# Patient Record
Sex: Female | Born: 1990
Health system: Southern US, Community
[De-identification: ages and names within clinical notes are randomized; demographics above are authoritative.]

## PROBLEM LIST (undated history)

## (undated) DIAGNOSIS — J45909 Unspecified asthma, uncomplicated: Secondary | ICD-10-CM

## (undated) DIAGNOSIS — N200 Calculus of kidney: Secondary | ICD-10-CM

## (undated) DIAGNOSIS — F419 Anxiety disorder, unspecified: Secondary | ICD-10-CM

## (undated) HISTORY — PX: WISDOM TOOTH EXTRACTION: SHX21

## (undated) HISTORY — DX: Anxiety disorder, unspecified: F41.9

## (undated) HISTORY — DX: Unspecified asthma, uncomplicated: J45.909

---

## 2014-03-12 ENCOUNTER — Encounter: Payer: Self-pay | Admitting: Family Medicine

## 2014-03-12 ENCOUNTER — Encounter (INDEPENDENT_AMBULATORY_CARE_PROVIDER_SITE_OTHER): Payer: Self-pay

## 2014-03-12 ENCOUNTER — Ambulatory Visit (INDEPENDENT_AMBULATORY_CARE_PROVIDER_SITE_OTHER): Payer: 59 | Admitting: Family Medicine

## 2014-03-12 VITALS — BP 110/62 | HR 70 | Temp 97.7°F | Ht 64.0 in | Wt 166.5 lb

## 2014-03-12 DIAGNOSIS — N644 Mastodynia: Secondary | ICD-10-CM

## 2014-03-12 NOTE — Assessment & Plan Note (Signed)
New-  Exam reassuring.  Probable benign/fibrocystic changes. US of bilateral breasts given duration of symptoms for further evaluation. The patient indicates understanding of these issues and agrees with the plan.  Orders Placed This Encounter  Procedures  . US Unlisted Procedure Breast

## 2014-03-12 NOTE — Addendum Note (Signed)
Addended by: Dianne DunARON, Keishaun Hazel M on: 03/12/2014 11:34 AM   Modules accepted: Orders

## 2014-03-12 NOTE — Patient Instructions (Signed)
It was nice to meet you. Please stop by to see Heather Burns on your way out to set up your ultrasound.  We will call you as soon as we have the results.

## 2014-03-12 NOTE — Progress Notes (Signed)
Pre visit review using our clinic review tool, if applicable. No additional management support is needed unless otherwise documented below in the visit note. 

## 2014-03-12 NOTE — Progress Notes (Signed)
Subjective:   Patient ID: Heather ParsonsLauren Ellerbrock, female    DOB: 07/17/1990, 24 y.o.   MRN: [id number]  Heather Burns is a pleasant 24 y.o. year old female who presents to clinic today with Establish Care and Breast Pain  on 03/12/2014  HPI: Virginal, G0. Past month has been having bilateral breast pain, intermittent. No changes in caffeine intake or bras. Has not noticed any masses or nipple discharge. No change with menstruation.  No family h/o breast CA.  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  No known injury.  Last pap smear in 11/2013.  No current outpatient prescriptions on file prior to visit.   No current facility-administered medications on file prior to visit.    No Known Allergies  Past Medical History  Diagnosis Date  . Asthma   . Anxiety     Past Surgical History  Procedure Laterality Date  . Wisdom tooth extraction      Family History  Problem Relation Age of Onset  . Diabetes Maternal Grandmother   . Diabetes Paternal Grandmother     History   Social History  . Marital Status: Single    Spouse Name: N/A    Number of Children: N/A  . Years of Education: N/A   Occupational History  . Not on file.   Social History Main Topics  . Smoking status: Never Smoker   . Smokeless tobacco: Never Used  . Alcohol Use: No  . Drug Use: No  . Sexual Activity: No   Other Topics Concern  . Not on file   Social History Narrative  . No narrative on file   The PMH, PSH, Social History, Family History, Medications, and allergies [facility], and have been updated if relevant.   Review of Systems  Constitutional: Negative.   HENT: Negative.   Eyes: Negative.   Respiratory: Negative.   Cardiovascular: Negative.   Gastrointestinal: Negative.   Genitourinary: Negative.   Musculoskeletal: Negative.   Allergic/Immunologic: Negative.   Neurological: Negative.   Hematological: Negative.   Psychiatric/Behavioral: Negative.   All other  systems reviewed and are negative.      Objective:    BP 110/62 mmHg  Pulse 70  Temp(Src) 97.7 F (36.5 C) (Oral)  Ht 5\' 4"  (1.626 m)  Wt 166 lb 8 oz (75.524 kg)  BMI 28.57 kg/m2  SpO2 99%  LMP 02/24/2014   Physical Exam  Constitutional: She is oriented to person, place, and time. She appears well-developed and well-nourished. No distress.  HENT:  Head: Normocephalic.  Eyes: Conjunctivae are normal.  Neck: Normal range of motion.  Cardiovascular: Normal rate.   Pulmonary/Chest: Effort normal and breath sounds normal. Chest wall is not dull to percussion. She exhibits no mass, no tenderness, no bony tenderness, no laceration, no crepitus, no edema, no deformity, no swelling and no retraction. Right breast exhibits no inverted nipple, no mass, no nipple discharge, no skin change and no tenderness. Left breast exhibits no inverted nipple, no mass, no nipple discharge, no skin change and no tenderness. Breasts are symmetrical.  Abdominal: Soft.  Musculoskeletal: Normal range of motion.  Neurological: She is alert and oriented to person, place, and time. No cranial nerve deficit.  Skin: Skin is warm and dry.  Psychiatric: She has a normal mood and affect. Her behavior is normal. Judgment and thought content normal.  Nursing note and vitals reviewed.         Assessment & Plan:  Breast pain in female No Follow-up on file.

## 2014-03-27 ENCOUNTER — Ambulatory Visit: Payer: Self-pay | Admitting: Family Medicine

## 2014-03-31 ENCOUNTER — Encounter: Payer: Self-pay | Admitting: Family Medicine

## 2014-05-07 ENCOUNTER — Ambulatory Visit (INDEPENDENT_AMBULATORY_CARE_PROVIDER_SITE_OTHER): Payer: 59 | Admitting: Family Medicine

## 2014-05-07 ENCOUNTER — Encounter: Payer: Self-pay | Admitting: Family Medicine

## 2014-05-07 VITALS — BP 126/88 | HR 81 | Temp 97.9°F | Wt 167.2 lb

## 2014-05-07 DIAGNOSIS — F329 Major depressive disorder, single episode, unspecified: Secondary | ICD-10-CM | POA: Insufficient documentation

## 2014-05-07 DIAGNOSIS — K5900 Constipation, unspecified: Secondary | ICD-10-CM

## 2014-05-07 DIAGNOSIS — F419 Anxiety disorder, unspecified: Principal | ICD-10-CM

## 2014-05-07 DIAGNOSIS — F32A Depression, unspecified: Secondary | ICD-10-CM

## 2014-05-07 DIAGNOSIS — F418 Other specified anxiety disorders: Secondary | ICD-10-CM

## 2014-05-07 MED ORDER — SERTRALINE HCL 25 MG PO TABS: 25.0000 mg | ORAL_TABLET | Freq: Every day | ORAL | Status: DC

## 2014-05-07 NOTE — Progress Notes (Signed)
Pre visit review using our clinic review tool, if applicable. No additional management support is needed unless otherwise documented below in the visit note. 

## 2014-05-07 NOTE — Progress Notes (Signed)
Subjective:   Patient ID: Heather Burns, female    DOB: 1990/10/19, 24 y.o.   MRN: [id number]  Heather Burns is a pleasant 24 y.o. year old female who presents to clinic today with Anxiety and Depression  on 05/07/2014  HPI:  Here with her mom.  2 years ago, diagnosed with anxiety/depression.  Placed on antidepressant- cannot remember which one- awaiting records. Felt it was not that helpful.   Past few weeks, more tearful.  Wakes up at night feeling panicky and anxious. Nothing new going on that is making her feel more worried.  She is still working and going to school.  Sleeping well.  Appetite good.  No SI or HI.  Has had some low back pressure and more firm stools over the past month.   Admits to not eating many fruits or vegetables.  Trying to drink more water.  Wt Readings from Last 3 Encounters:  05/07/14 167 lb 4 oz (75.864 kg)  03/12/14 166 lb 8 oz (75.524 kg)    No current outpatient prescriptions on file prior to visit.   No current facility-administered medications on file prior to visit.    No Known Allergies  Past Medical History  Diagnosis Date  . Asthma   . Anxiety     Past Surgical History  Procedure Laterality Date  . Wisdom tooth extraction      Family History  Problem Relation Age of Onset  . Diabetes Maternal Grandmother   . Diabetes Paternal Grandmother     History   Social History  . Marital Status: Single    Spouse Name: N/A  . Number of Children: N/A  . Years of Education: N/A   Occupational History  . Not on file.   Social History Main Topics  . Smoking status: Never Smoker   . Smokeless tobacco: Never Used  . Alcohol Use: No  . Drug Use: No  . Sexual Activity: No   Other Topics Concern  . Not on file   Social History Narrative   The PMH, PSH, Social History, Family History, Medications, and allergies have been reviewed in [facility], and have been updated if relevant.  Review of Systems  Constitutional: Negative for appetite  change and unexpected weight change.  Gastrointestinal: Positive for constipation. Negative for nausea, vomiting, abdominal pain, diarrhea, blood in stool, abdominal distention, anal bleeding and rectal pain.  Endocrine: Negative.   Genitourinary: Negative.   Musculoskeletal: Positive for back pain.  Neurological: Negative.   Hematological: Negative.   Psychiatric/Behavioral: Positive for dysphoric mood. Negative for suicidal ideas, hallucinations, behavioral problems, confusion, sleep disturbance, self-injury, decreased concentration and agitation. The patient is nervous/anxious. The patient is not hyperactive.   All other systems reviewed and are negative.      Objective:    BP 126/88 mmHg  Pulse 81  Temp(Src) 97.9 F (36.6 C) (Oral)  Wt 167 lb 4 oz (75.864 kg)  SpO2 97%  LMP 04/15/2014  Wt Readings from Last 3 Encounters:  05/07/14 167 lb 4 oz (75.864 kg)  03/12/14 166 lb 8 oz (75.524 kg)     Physical Exam  Constitutional: She is oriented to person, place, and time. She appears well-developed and well-nourished. No distress.  HENT:  Head: Normocephalic and atraumatic.  Eyes: Conjunctivae are normal.  Neck: Normal range of motion.  Cardiovascular: Normal rate.   Pulmonary/Chest: Effort normal.  Abdominal: Soft. Bowel sounds are normal. She exhibits no distension. There is no tenderness. There is no rebound and no guarding.  Musculoskeletal: She exhibits no edema.  Neurological: She is alert and oriented to person, place, and time. No cranial nerve deficit.  Psychiatric: Her behavior is normal. Judgment and thought content normal.  Moderately anxious today but pleasant and appropriate conversation  Nursing note and vitals reviewed.         Assessment & Plan:   No diagnosis found. No Follow-up on file.

## 2014-05-07 NOTE — Assessment & Plan Note (Signed)
New- discussed increased fiber intake, start colace. If no results, she may try miralax and push fluids. Call or return to clinic prn if these symptoms worsen or fail to improve as anticipated. The patient indicates understanding of these issues and agrees with the plan.

## 2014-05-07 NOTE — Patient Instructions (Signed)
Great to see you. We are starting Zoloft 25 mg daily.  Please call me in a few weeks with an update.   Try Colace first. If no results, then try Miralax.

## 2014-05-07 NOTE — Assessment & Plan Note (Signed)
>  25 minutes spent in face to face time with patient, >50% spent in counselling or coordination of care Deteriorated- will request records from previous PCP but since she did not have good response from previous rx, she does want to try another rx.   When I asked her about zoloft (sertraline), she was confident that this was not the one she had been on previously. Start Zoloft 25 mg daily.  She defers psychotherapy. She will call me in a few weeks with an update. The patient indicates understanding of these issues and agrees with the plan.

## 2014-05-12 ENCOUNTER — Telehealth: Payer: Self-pay

## 2014-05-12 NOTE — Telephone Encounter (Signed)
Heather Burns pts mother left v/m; pt was seen on 05/07/14 and given zoloft 25 mg; Heather Burns said zoloft made pt more anxious and panicky. Heather Burns advised pt to stop med. Heather Burns wants to know what to do? Do not see DPR signed to talk with Heather Burns.Please advise.

## 2014-05-12 NOTE — Telephone Encounter (Signed)
Lm on pts vm requesting a call back 

## 2014-05-12 NOTE — Telephone Encounter (Signed)
We can try another SSRI or we can refer her for psychotherapy

## 2014-05-13 MED ORDER — ESCITALOPRAM OXALATE 10 MG PO TABS: 10.0000 mg | ORAL_TABLET | Freq: Every day | ORAL | Status: DC

## 2014-05-13 NOTE — Telephone Encounter (Signed)
Pt returned you called [facility] number to call back [phone]

## 2014-05-13 NOTE — Telephone Encounter (Signed)
Will try lexapro.  eRx sent.  Please keep us updated.

## 2014-05-13 NOTE — Addendum Note (Signed)
Addended by: Dianne DunARON, Vuk Skillern M on: 05/13/2014 10:24 AM   Modules accepted: Orders

## 2014-05-13 NOTE — Telephone Encounter (Signed)
Spoke to pt and advised; states that she prefers to try an alternate medication.

## 2014-05-13 NOTE — Telephone Encounter (Signed)
Lm on pts vm and advised 

## 2014-06-26 ENCOUNTER — Ambulatory Visit: Payer: 59 | Admitting: Family Medicine

## 2014-07-03 ENCOUNTER — Ambulatory Visit (INDEPENDENT_AMBULATORY_CARE_PROVIDER_SITE_OTHER): Payer: 59 | Admitting: Family Medicine

## 2014-07-03 ENCOUNTER — Encounter: Payer: Self-pay | Admitting: Family Medicine

## 2014-07-03 VITALS — BP 128/82 | HR 67 | Temp 97.8°F | Wt 170.0 lb

## 2014-07-03 DIAGNOSIS — N760 Acute vaginitis: Secondary | ICD-10-CM | POA: Diagnosis not present

## 2014-07-03 MED ORDER — TERCONAZOLE 0.4 % VA CREA: 1.0000 | TOPICAL_CREAM | Freq: Every day | VAGINAL | Status: DC

## 2014-07-03 NOTE — Progress Notes (Signed)
Pre visit review using our clinic review tool, if applicable. No additional management support is needed unless otherwise documented below in the visit note. 

## 2014-07-03 NOTE — Addendum Note (Signed)
Addended by: Desmond DikeKNIGHT, Tamaya Pun H on: 07/03/2014 08:42 AM   Modules accepted: Orders, Medications

## 2014-07-03 NOTE — Patient Instructions (Signed)
Good to see you.  Please use terazol as directed.  Monilial Vaginitis Vaginitis in a soreness, swelling and redness (inflammation) of the vagina and vulva. Monilial vaginitis is not a sexually transmitted infection. CAUSES  Yeast vaginitis is caused by yeast (candida) that is normally found in your vagina. With a yeast infection, the candida has overgrown in number to a point that upsets the chemical balance. SYMPTOMS   White, thick vaginal discharge.  Swelling, itching, redness and irritation of the vagina and possibly the lips of the vagina (vulva).  Burning or painful urination.  Painful intercourse. DIAGNOSIS  Things that may contribute to monilial vaginitis are:  Postmenopausal and virginal states.  Pregnancy.  Infections.  Being tired, sick or stressed, especially if you had monilial vaginitis in the past.  Diabetes. Good control will help lower the chance.  Birth control pills.  Tight fitting garments.  Using bubble bath, feminine sprays, douches or deodorant tampons.  Taking certain medications that kill germs (antibiotics).  Sporadic recurrence can occur if you become ill. TREATMENT  Your caregiver will give you medication.  There are several kinds of anti monilial vaginal creams and suppositories specific for monilial vaginitis. For recurrent yeast infections, use a suppository or cream in the vagina 2 times a week, or as directed.  Anti-monilial or steroid cream for the itching or irritation of the vulva may also be used. Get your caregiver's permission.  Painting the vagina with methylene blue solution may help if the monilial cream does not work.  Eating yogurt may help prevent monilial vaginitis. HOME CARE INSTRUCTIONS   Finish all medication as prescribed.  Do not have sex until treatment is completed or after your caregiver tells you it is okay.  Take warm sitz baths.  Do not douche.  Do not use tampons, especially scented ones.  Wear  cotton underwear.  Avoid tight pants and panty hose.  Tell your sexual partner that you have a yeast infection. They should go to their caregiver if they have symptoms such as mild rash or itching.  Your sexual partner should be treated as well if your infection is difficult to eliminate.  Practice safer sex. Use condoms.  Some vaginal medications cause latex condoms to fail. Vaginal medications that harm condoms are:  Cleocin cream.  Butoconazole (Femstat).  Terconazole (Terazol) vaginal suppository.  Miconazole (Monistat) (may be purchased over the counter). SEEK MEDICAL CARE IF:   You have a temperature by mouth above 102 F (38.9 C).  The infection is getting worse after 2 days of treatment.  The infection is not getting better after 3 days of treatment.  You develop blisters in or around your vagina.  You develop vaginal bleeding, and it is not your menstrual period.  You have pain when you urinate.  You develop intestinal problems.  You have pain with sexual intercourse. Document Released: 12/01/2004 Document Revised: 05/16/2011 Document Reviewed: 08/15/2008 [facility] Patient Information 2015 [employer] [place]. This information is not intended to replace advice given to you by your health care provider. Make sure you discuss any questions you have with your health care provider.

## 2014-07-03 NOTE — Progress Notes (Signed)
SUBJECTIVE:  24 y.o. female complains of white, malodorous, thick and vaginal erythema noted vaginal discharge for 3 month(s). Denies abnormal vaginal bleeding or significant pelvic pain or fever. No UTI symptoms. Denies history of known exposure to STD- she is virginal.  Current Outpatient Prescriptions on File Prior to Visit  Medication Sig Dispense Refill  . escitalopram (LEXAPRO) 10 MG tablet Take 1 tablet (10 mg total) by mouth daily. 30 tablet 1  . sertraline (ZOLOFT) 25 MG tablet Take 1 tablet (25 mg total) by mouth daily. 30 tablet 1   No current facility-administered medications on file prior to visit.    No Known Allergies  Past Medical History  Diagnosis Date  . Asthma   . Anxiety     Past Surgical History  Procedure Laterality Date  . Wisdom tooth extraction      Family History  Problem Relation Age of Onset  . Diabetes Maternal Grandmother   . Diabetes Paternal Grandmother     History   Social History  . Marital Status: Single    Spouse Name: N/A  . Number of Children: N/A  . Years of Education: N/A   Occupational History  . Not on file.   Social History Main Topics  . Smoking status: Never Smoker   . Smokeless tobacco: Never Used  . Alcohol Use: No  . Drug Use: No  . Sexual Activity: No   Other Topics Concern  . Not on file   Social History Narrative   The PMH, PSH, Social History, Family History, Medications, and allergies [facility], and have been updated if relevant.  Patient's last menstrual period was 06/26/2014.  OBJECTIVE:  BP 128/82 mmHg  Pulse 67  Temp(Src) 97.8 F (36.6 C) (Oral)  Wt 170 lb (77.111 kg)  SpO2 99%  LMP 06/26/2014  She appears well, afebrile. Abdomen: benign, soft, nontender, no masses. Pelvic Exam: normal external genitalia, vulva, vagina, cervix, uterus and adnexa, VULVA: normal appearing vulva with no masses, tenderness or lesions, vulvar erythema noted along with thick, white discharge in  vault.   ASSESSMENT:  Mobiluncus vaginitis  PLAN:  Treatment: Terazol cream ROV prn if symptoms persist or worsen.

## 2014-07-25 ENCOUNTER — Telehealth: Payer: Self-pay

## 2014-07-25 MED ORDER — ESCITALOPRAM OXALATE 10 MG PO TABS: 10.0000 mg | ORAL_TABLET | Freq: Every day | ORAL | Status: DC

## 2014-07-25 NOTE — Telephone Encounter (Signed)
Ok to refill as requested 

## 2014-07-25 NOTE — Telephone Encounter (Signed)
Pt has been taking escitalopram and is doing well and feeling better; pt took last pill today and request refill today. Advised will refill to [facility] [address] and send note to Dr Dayton MartesAron with update on how pt is feeling. Pt last seen for 05/07/14 for depression. Pt voiced understanding. This is FYI for Dr Dayton MartesAron.

## 2014-08-14 ENCOUNTER — Other Ambulatory Visit: Payer: Self-pay | Admitting: *Deleted

## 2014-08-14 MED ORDER — ESCITALOPRAM OXALATE 10 MG PO TABS: 10.0000 mg | ORAL_TABLET | Freq: Every day | ORAL | Status: DC

## 2014-08-14 NOTE — Telephone Encounter (Signed)
Pt was to contact office with update of effectiveness of medication. No notes found in her chart regarding. Pt is now requesting medication refill. Pls advise

## 2014-08-22 ENCOUNTER — Other Ambulatory Visit: Payer: Self-pay | Admitting: Family Medicine

## 2014-09-26 ENCOUNTER — Telehealth: Payer: Self-pay | Admitting: *Deleted

## 2014-09-26 MED ORDER — ESCITALOPRAM OXALATE 10 MG PO TABS
10.0000 mg | ORAL_TABLET | Freq: Every day | ORAL | Status: DC
Start: 2014-09-26 — End: 2015-01-26

## 2014-09-26 NOTE — Telephone Encounter (Signed)
Lm on pts vm and informed her that she did not receive additional tabs, because she was to advise Dr Dayton Martes of effectiveness before receiving additional refills. Rx sent to mail order. DPR is not signed to speak with pts mother.

## 2014-09-26 NOTE — Telephone Encounter (Signed)
Please call pt's mom.  Ok to refill as requested.

## 2014-09-26 NOTE — Telephone Encounter (Signed)
Pt mother, Alvis Lemmings, called back and request call back [phone]

## 2014-09-26 NOTE — Telephone Encounter (Signed)
Spoke to pts mother and advised her that she is not on pts DPR.

## 2014-09-26 NOTE — Telephone Encounter (Signed)
Pt's mother left voicemail at Triage. Mother said pt usually gets her Lexapro through mail order pharmacy. They said the pharmacy advise her we denied her last refill request and they didn't realize it until they didn't receive med in the mail. Pt is going to be out of medication tomorrow and her mother doesn't want her to have a lapse in taking her medication. Mother request call back to figure out what to do to get her med ASAP

## 2014-11-24 ENCOUNTER — Ambulatory Visit (INDEPENDENT_AMBULATORY_CARE_PROVIDER_SITE_OTHER): Payer: 59 | Admitting: Family Medicine

## 2014-11-24 ENCOUNTER — Encounter: Payer: Self-pay | Admitting: Family Medicine

## 2014-11-24 VITALS — BP 122/80 | HR 81 | Temp 97.5°F | Ht 64.0 in | Wt 197.8 lb

## 2014-11-24 DIAGNOSIS — J209 Acute bronchitis, unspecified: Secondary | ICD-10-CM

## 2014-11-24 MED ORDER — ALBUTEROL SULFATE HFA 108 (90 BASE) MCG/ACT IN AERS: 2.0000 | INHALATION_SPRAY | RESPIRATORY_TRACT | Status: DC | PRN

## 2014-11-24 MED ORDER — AZITHROMYCIN 250 MG PO TABS: ORAL_TABLET | ORAL | Status: DC

## 2014-11-24 MED ORDER — HYDROCODONE-HOMATROPINE 5-1.5 MG/5ML PO SYRP: 5.0000 mL | ORAL_SOLUTION | Freq: Three times a day (TID) | ORAL | Status: DC | PRN

## 2014-11-24 NOTE — Patient Instructions (Signed)
Drink lots of fluids and rest  Use inhaler as needed for wheezing - if worse let me know  Take the zpak as directed for bronchitis and sinus infection  Try hycodan for cough - when not working or driving , otherwise use delsym   Update if not starting to improve in a week or if worsening

## 2014-11-24 NOTE — Progress Notes (Signed)
Subjective:    Patient ID: Heather Burns, female    DOB: 01-May-1990, 24 y.o.   MRN: [id number]  HPI Here with cough   Started with uri last week  That is improving (congestion)   Can't stop coughing , productive of mucous (? Color) No fever  Often gets respiratory complication after a cold Has hx of asthma (usually flares when she gets sick)   Has been wheezing - needs refill on her inhaler   Throat is ok - irritated  Ears are ok   No fever  Had aches last week   Tried delsym otc -did not help very much  Patient Active Problem List   Diagnosis Date Noted  . Anxiety and depression 05/07/2014  . Constipation 05/07/2014   Past Medical History  Diagnosis Date  . Asthma   . Anxiety    Past Surgical History  Procedure Laterality Date  . Wisdom tooth extraction     Social History  Substance Use Topics  . Smoking status: Never Smoker   . Smokeless tobacco: Never Used  . Alcohol Use: No   Family History  Problem Relation Age of Onset  . Diabetes Maternal Grandmother   . Diabetes Paternal Grandmother    No Known Allergies Current Outpatient Prescriptions on File Prior to Visit  Medication Sig Dispense Refill  . escitalopram (LEXAPRO) 10 MG tablet Take 1 tablet (10 mg total) by mouth daily. 90 tablet 0  . terconazole (TERAZOL 7) 0.4 % vaginal cream Place 1 applicator vaginally at bedtime. 45 g 0   No current facility-administered medications on file prior to visit.       Review of Systems Review of Systems  Constitutional: Negative for fever, appetite change,  and unexpected weight change.  ENT pos for cong and rhinorrhea and sinus pain Eyes: Negative for pain and visual disturbance.  Respiratory: Negative for  shortness of breath.   Cardiovascular: Negative for cp or palpitations    Gastrointestinal: Negative for nausea, diarrhea and constipation.  Genitourinary: Negative for urgency and frequency.  Skin: Negative for pallor or rash   Neurological: Negative  for weakness, light-headedness, numbness and headaches.  Hematological: Negative for adenopathy. Does not bruise/bleed easily.  Psychiatric/Behavioral: Negative for dysphoric mood. The patient is not nervous/anxious.         Objective:   Physical Exam  Constitutional: She appears well-developed and well-nourished. No distress.  obese and well appearing   HENT:  Head: Normocephalic and atraumatic.  Right Ear: External ear normal.  Left Ear: External ear normal.  Mouth/Throat: Oropharynx is clear and moist. No oropharyngeal exudate.  Nares are injected and congested  Bilateral maxillary sinus tenderness  Post nasal drip   Eyes: Conjunctivae and EOM are normal. Pupils are equal, round, and reactive to light. Right eye exhibits no discharge. Left eye exhibits no discharge.  Neck: Normal range of motion. Neck supple.  Cardiovascular: Normal rate and regular rhythm.   Pulmonary/Chest: Effort normal and breath sounds normal. No respiratory distress. She has no wheezes. She has no rales.  No wheeze even on forced exp  Good air exch  Lymphadenopathy:    She has no cervical adenopathy.  Neurological: She is alert. No cranial nerve deficit.  Skin: Skin is warm and dry. No rash noted.  Psychiatric: She has a normal mood and affect.          Assessment & Plan:   Problem List Items Addressed This Visit      Respiratory   Acute bronchitis -  Primary    Cover with zpak  Clear lung exam- refilled albuterol mdi for prn use -will update if any persistent wheezing Disc symptomatic care - see instructions on AVS  Hycodan for pm cough with caution Update if not starting to improve in a week or if worsening

## 2014-11-24 NOTE — Assessment & Plan Note (Signed)
Cover with zpak  Clear lung exam- refilled albuterol mdi for prn use -will update if any persistent wheezing Disc symptomatic care - see instructions on AVS  Hycodan for pm cough with caution Update if not starting to improve in a week or if worsening

## 2014-11-24 NOTE — Progress Notes (Signed)
Pre visit review using our clinic review tool, if applicable. No additional management support is needed unless otherwise documented below in the visit note. 

## 2014-12-31 ENCOUNTER — Other Ambulatory Visit: Payer: Self-pay | Admitting: Family Medicine

## 2015-01-01 NOTE — Telephone Encounter (Signed)
Pt estab 03/2014

## 2015-01-26 ENCOUNTER — Encounter: Payer: Self-pay | Admitting: Family Medicine

## 2015-01-26 ENCOUNTER — Ambulatory Visit (INDEPENDENT_AMBULATORY_CARE_PROVIDER_SITE_OTHER): Payer: 59 | Admitting: Family Medicine

## 2015-01-26 VITALS — BP 122/64 | HR 74 | Temp 97.9°F | Wt 200.8 lb

## 2015-01-26 DIAGNOSIS — H109 Unspecified conjunctivitis: Secondary | ICD-10-CM | POA: Diagnosis not present

## 2015-01-26 MED ORDER — POLYMYXIN B-TRIMETHOPRIM 10000-0.1 UNIT/ML-% OP SOLN: 1.0000 [drp] | OPHTHALMIC | Status: DC

## 2015-01-26 MED ORDER — ESCITALOPRAM OXALATE 10 MG PO TABS: 10.0000 mg | ORAL_TABLET | Freq: Every day | ORAL | Status: DC

## 2015-01-26 NOTE — Progress Notes (Signed)
SUBJECTIVE:  24 y.o. female with burning, redness, discharge and mattering in both eyes for 3 days.  No other symptoms.  No significant prior ophthalmological history. No change in visual acuity, no photophobia, no severe eye pain.  Current Outpatient Prescriptions on File Prior to Visit  Medication Sig Dispense Refill  . albuterol (PROVENTIL HFA;VENTOLIN HFA) 108 (90 BASE) MCG/ACT inhaler Inhale 2 puffs into the lungs every 4 (four) hours as needed for wheezing. 1 Inhaler 0  . escitalopram (LEXAPRO) 10 MG tablet Take 1 tablet (10 mg total) by mouth daily. 90 tablet 0  . terconazole (TERAZOL 7) 0.4 % vaginal cream Place 1 applicator vaginally at bedtime. 45 g 0   No current facility-administered medications on file prior to visit.    No Known Allergies  Past Medical History  Diagnosis Date  . Asthma   . Anxiety     Past Surgical History  Procedure Laterality Date  . Wisdom tooth extraction      Family History  Problem Relation Age of Onset  . Diabetes Maternal Grandmother   . Diabetes Paternal Grandmother     Social History   Social History  . Marital Status: Single    Spouse Name: N/A  . Number of Children: N/A  . Years of Education: N/A   Occupational History  . Not on file.   Social History Main Topics  . Smoking status: Never Smoker   . Smokeless tobacco: Never Used  . Alcohol Use: No  . Drug Use: No  . Sexual Activity: No   Other Topics Concern  . Not on file   Social History Narrative   The PMH, PSH, Social History, Family History, Medications, and allergies [facility], and have been updated if relevant.  OBJECTIVE:  BP 122/64 mmHg  Pulse 74  Temp(Src) 97.9 F (36.6 C) (Oral)  Wt 200 lb 12 oz (91.06 kg)  SpO2 98%  LMP 01/23/2015  Patient appears well, vitals signs are normal. Eyes: both eyes with findings of typical conjunctivitis noted; erythema and discharge. PERRLA, no foreign body noted. No periorbital cellulitis. The corneas  are clear and fundi normal. Visual acuity normal.   ASSESSMENT:  Conjunctivitis - probably bacterial  PLAN:  Antibiotic drops per order. Hygiene discussed. If other family members develop same condition, may use same medication for them if they are not known to be allergic to it. Call prn.

## 2015-01-26 NOTE — Progress Notes (Signed)
Pre visit review using our clinic review tool, if applicable. No additional management support is needed unless otherwise documented below in the visit note. 

## 2015-01-26 NOTE — Addendum Note (Signed)
Addended by: Dianne DunARON, TALIA M on: 01/26/2015 10:57 AM   Modules accepted: Orders

## 2015-01-26 NOTE — Patient Instructions (Signed)

## 2015-04-13 ENCOUNTER — Ambulatory Visit (INDEPENDENT_AMBULATORY_CARE_PROVIDER_SITE_OTHER): Payer: 59 | Admitting: Primary Care

## 2015-04-13 ENCOUNTER — Encounter: Payer: Self-pay | Admitting: Primary Care

## 2015-04-13 VITALS — BP 118/72 | HR 89 | Temp 97.5°F | Ht 64.0 in | Wt 199.8 lb

## 2015-04-13 DIAGNOSIS — R059 Cough, unspecified: Secondary | ICD-10-CM

## 2015-04-13 DIAGNOSIS — R05 Cough: Secondary | ICD-10-CM

## 2015-04-13 MED ORDER — BENZONATATE 200 MG PO CAPS: 200.0000 mg | ORAL_CAPSULE | Freq: Three times a day (TID) | ORAL | Status: DC | PRN

## 2015-04-13 NOTE — Patient Instructions (Signed)
You may take Benzonatate capsules for cough. Take 1 capsule by mouth three times daily as needed for cough.  Nasal Congestion: Try using Flonase (fluticasone) nasal spray. Instill 2 sprays in each nostril once daily.   Increase consumption of water to stay hydrated.  It was a pleasure meeting you!  Upper Respiratory Infection, Adult Most upper respiratory infections (URIs) are a viral infection of the air passages leading to the lungs. A URI affects the nose, throat, and upper air passages. The most common type of URI is nasopharyngitis and is typically referred to as "the common cold." URIs run their course and usually go away on their own. Most of the time, a URI does not require medical attention, but sometimes a bacterial infection in the upper airways can follow a viral infection. This is called a secondary infection. Sinus and middle ear infections are common types of secondary upper respiratory infections. Bacterial pneumonia can also complicate a URI. A URI can worsen asthma and chronic obstructive pulmonary disease (COPD). Sometimes, these complications can require emergency medical care and may be life threatening.  CAUSES Almost all URIs are caused by viruses. A virus is a type of germ and can spread from one person to another.  RISKS FACTORS You may be at risk for a URI if:   You smoke.   You have chronic heart or lung disease.  You have a weakened defense (immune) system.   You are very young or very old.   You have nasal allergies or asthma.  You work in crowded or poorly ventilated areas.  You work in health care facilities or schools. SIGNS AND SYMPTOMS  Symptoms typically develop 2-3 days after you come in contact with a cold virus. Most viral URIs last 7-10 days. However, viral URIs from the influenza virus (flu virus) can last 14-18 days and are typically more severe. Symptoms may include:   Runny or stuffy (congested) nose.   Sneezing.   Cough.   Sore  throat.   Headache.   Fatigue.   Fever.   Loss of appetite.   Pain in your forehead, behind your eyes, and over your cheekbones (sinus pain).  Muscle aches.  DIAGNOSIS  Your health care provider may diagnose a URI by:  Physical exam.  Tests to check that your symptoms are not due to another condition such as:  Strep throat.  Sinusitis.  Pneumonia.  Asthma. TREATMENT  A URI goes away on its own with time. It cannot be cured with medicines, but medicines may be prescribed or recommended to relieve symptoms. Medicines may help:  Reduce your fever.  Reduce your cough.  Relieve nasal congestion. HOME CARE INSTRUCTIONS   Take medicines only as directed by your health care provider.   Gargle warm saltwater or take cough drops to comfort your throat as directed by your health care provider.  Use a warm mist humidifier or inhale steam from a shower to increase air moisture. This may make it easier to breathe.  Drink enough fluid to keep your urine clear or pale yellow.   Eat soups and other clear broths and maintain good nutrition.   Rest as needed.   Return to work when your temperature has returned to normal or as your health care provider advises. You may need to stay home longer to avoid infecting others. You can also use a face mask and careful hand washing to prevent spread of the virus.  Increase the usage of your inhaler if you have asthma.  Do not use any tobacco products, including cigarettes, chewing tobacco, or electronic cigarettes. If you need help quitting, ask your health care provider. PREVENTION  The best way to protect yourself from getting a cold is to practice good hygiene.   Avoid oral or hand contact with people with cold symptoms.   Wash your hands often if contact occurs.  There is no clear evidence that vitamin C, vitamin E, echinacea, or exercise reduces the chance of developing a cold. However, it is always recommended to  get plenty of rest, exercise, and practice good nutrition.  SEEK MEDICAL CARE IF:   You are getting worse rather than better.   Your symptoms are not controlled by medicine.   You have chills.  You have worsening shortness of breath.  You have brown or red mucus.  You have yellow or brown nasal discharge.  You have pain in your face, especially when you bend forward.  You have a fever.  You have swollen neck glands.  You have pain while swallowing.  You have white areas in the back of your throat. SEEK IMMEDIATE MEDICAL CARE IF:   You have severe or persistent:  Headache.  Ear pain.  Sinus pain.  Chest pain.  You have chronic lung disease and any of the following:  Wheezing.  Prolonged cough.  Coughing up blood.  A change in your usual mucus.  You have a stiff neck.  You have changes in your:  Vision.  Hearing.  Thinking.  Mood. MAKE SURE YOU:   Understand these instructions.  Will watch your condition.  Will get help right away if you are not doing well or get worse.   This information is not intended to replace advice given to you by your health care provider. Make sure you discuss any questions you have with your health care provider.   Document Released: 08/17/2000 Document Revised: 07/08/2014 Document Reviewed: 05/29/2013 Elsevier Interactive Patient Education [employer].

## 2015-04-13 NOTE — Progress Notes (Signed)
Subjective:    Patient ID: Heather Burns, female    DOB: [birth year], 25 y.o.   MRN: [id number]  HPI  Heather Burns is a 25 year old female who presents today with a chief complaint of cough. She also reports nasal congestion, sore throat, low grade fevers, chills. Her symptoms have been present since Thursday night with her cough starting last night. She's taken Mucinex and generic cold and sinus with temporary improvement. Her cough is non productive. She works in a childcare center. Her cough is the most bothersome symptom.   Review of Systems  Constitutional: Positive for fever, chills and fatigue.  HENT: Positive for congestion and sore throat.   Respiratory: Positive for cough. Negative for shortness of breath.   Cardiovascular: Negative for chest pain.       Past Medical History  Diagnosis Date  . Asthma   . Anxiety     Social History   Social History  . Marital Status: Single    Spouse Name: N/A  . Number of Children: N/A  . Years of Education: N/A   Occupational History  . Not on file.   Social History Main Topics  . Smoking status: Never Smoker   . Smokeless tobacco: Never Used  . Alcohol Use: No  . Drug Use: No  . Sexual Activity: No   Other Topics Concern  . Not on file   Social History Narrative    Past Surgical History  Procedure Laterality Date  . Wisdom tooth extraction      Family History  Problem Relation Age of Onset  . Diabetes Maternal Grandmother   . Diabetes Paternal Grandmother     No Known Allergies  Current Outpatient Prescriptions on File Prior to Visit  Medication Sig Dispense Refill  . albuterol (PROVENTIL HFA;VENTOLIN HFA) 108 (90 BASE) MCG/ACT inhaler Inhale 2 puffs into the lungs every 4 (four) hours as needed for wheezing. 1 Inhaler 0  . escitalopram (LEXAPRO) 10 MG tablet Take 1 tablet (10 mg total) by mouth daily. 90 tablet 0  . terconazole (TERAZOL 7) 0.4 % vaginal cream Place 1 applicator vaginally at bedtime. 45 g 0  .  trimethoprim-polymyxin b (POLYTRIM) ophthalmic solution Place 1 drop into both eyes every 4 (four) hours. 10 mL 0   No current facility-administered medications on file prior to visit.    BP 118/72 mmHg  Pulse 89  Temp(Src) 97.5 F (36.4 C) (Oral)  Ht  (1.626 m)  Wt 199 lb 12.8 oz (90.629 kg)  BMI 34.28 kg/m2  SpO2 98%  LMP 03/19/2015    Objective:   Physical Exam  Constitutional: She appears well-nourished.  HENT:  Right Ear: Tympanic membrane and ear canal normal.  Left Ear: Tympanic membrane and ear canal normal.  Nose: Mucosal edema present. Right sinus exhibits no maxillary sinus tenderness and no frontal sinus tenderness. Left sinus exhibits no maxillary sinus tenderness and no frontal sinus tenderness.  Mouth/Throat: Posterior oropharyngeal erythema present. No oropharyngeal exudate or posterior oropharyngeal edema.  Neck: Neck supple.  Cardiovascular: Normal rate and regular rhythm.   Pulmonary/Chest: Effort normal and breath sounds normal.  Lymphadenopathy:    She has no cervical adenopathy.  Skin: Skin is warm and dry.          Assessment & Plan:  URI:  Cough, nasal congestion, low grade fevers, fatigue x 4 days. Some improvement with OTC's. Exam mostly unremarkable. Lungs clear, vitals stable in clinic. Does not appear sickly. Suspect viral UR and will  treat with supportive measures. Flonase, mucinex, fluids. RX for tessalon pearls sent to pharmacy for cough. Return precautions provided.

## 2015-04-13 NOTE — Progress Notes (Signed)
Pre visit review using our clinic review tool, if applicable. No additional management support is needed unless otherwise documented below in the visit note. 

## 2015-05-18 ENCOUNTER — Ambulatory Visit (INDEPENDENT_AMBULATORY_CARE_PROVIDER_SITE_OTHER): Payer: 59 | Admitting: Family Medicine

## 2015-05-18 ENCOUNTER — Encounter: Payer: Self-pay | Admitting: Family Medicine

## 2015-05-18 VITALS — BP 118/62 | HR 65 | Temp 97.6°F | Wt 203.2 lb

## 2015-05-18 DIAGNOSIS — F329 Major depressive disorder, single episode, unspecified: Secondary | ICD-10-CM

## 2015-05-18 DIAGNOSIS — N760 Acute vaginitis: Secondary | ICD-10-CM

## 2015-05-18 DIAGNOSIS — R10A0 Flank pain, unspecified side: Secondary | ICD-10-CM

## 2015-05-18 DIAGNOSIS — Z23 Encounter for immunization: Secondary | ICD-10-CM

## 2015-05-18 DIAGNOSIS — F418 Other specified anxiety disorders: Secondary | ICD-10-CM

## 2015-05-18 DIAGNOSIS — R109 Unspecified abdominal pain: Secondary | ICD-10-CM | POA: Diagnosis not present

## 2015-05-18 DIAGNOSIS — F32A Depression, unspecified: Secondary | ICD-10-CM

## 2015-05-18 DIAGNOSIS — F419 Anxiety disorder, unspecified: Principal | ICD-10-CM

## 2015-05-18 LAB — POC URINALSYSI DIPSTICK (AUTOMATED)
Bilirubin, UA: NEGATIVE
Glucose, UA: NEGATIVE
KETONES UA: NEGATIVE
LEUKOCYTES UA: NEGATIVE
Nitrite, UA: NEGATIVE
PH UA: 6
PROTEIN UA: NEGATIVE
SPEC GRAV UA: 1.01
Urobilinogen, UA: 0.2

## 2015-05-18 MED ORDER — FLUCONAZOLE 150 MG PO TABS: 150.0000 mg | ORAL_TABLET | Freq: Once | ORAL | Status: AC

## 2015-05-18 NOTE — Progress Notes (Signed)
Pre visit review using our clinic review tool, if applicable. No additional management support is needed unless otherwise documented below in the visit note. 

## 2015-05-18 NOTE — Progress Notes (Signed)
Subjective:   Patient ID: Heather ParsonsLauren Wortley, female    DOB: 10/02/1990, 25 y.o.   MRN: [id number]  Heather Burns is a pleasant 25 y.o. year old female who presents to clinic today with Follow-up and [facility]  on 05/18/2015  HPI:  Anxiety/depression- she feels her symptoms are much better controlled.  Has been taking Lexapro 10 mg daily and attending weekly psychotherapy with Berkley HarveyLaurie Woodard.  She has now been changed to every 3 weeks psychotherapy because symptoms have been so well controlled.  She would like to wean off of lexapro. She tolerated lexapro ok.  She does feel it caused her to gain a little weight.  Vaginal burning- trying OTC monistat which did help a little. Virginal. No dysuria. No abdominal pain now, did have some suprapubic pain. No fevers.  Current Outpatient Prescriptions on File Prior to Visit  Medication Sig Dispense Refill  . albuterol (PROVENTIL HFA;VENTOLIN HFA) 108 (90 BASE) MCG/ACT inhaler Inhale 2 puffs into the lungs every 4 (four) hours as needed for wheezing. 1 Inhaler 0  . escitalopram (LEXAPRO) 10 MG tablet Take 1 tablet (10 mg total) by mouth daily. 90 tablet 0   No current facility-administered medications on file prior to visit.    No Known Allergies  Past Medical History  Diagnosis Date  . Asthma   . Anxiety     Past Surgical History  Procedure Laterality Date  . Wisdom tooth extraction      Family History  Problem Relation Age of Onset  . Diabetes Maternal Grandmother   . Diabetes Paternal Grandmother     Social History   Social History  . Marital Status: Single    Spouse Name: N/A  . Number of Children: N/A  . Years of Education: N/A   Occupational History  . Not on file.   Social History Main Topics  . Smoking status: Never Smoker   . Smokeless tobacco: Never Used  . Alcohol Use: No  . Drug Use: No  . Sexual Activity: No   Other Topics Concern  . Not on file   Social History Narrative   The PMH, PSH, Social History,  Family History, Medications, and allergies [facility], and have been updated if relevant.   Review of Systems  Constitutional: Negative for fever.  Eyes: Negative.   Respiratory: Negative.   Gastrointestinal: Negative for abdominal pain, blood in stool, abdominal distention and anal bleeding.  Endocrine: Negative.   Genitourinary: Positive for vaginal discharge and vaginal pain. Negative for dysuria.  Musculoskeletal: Negative.   Skin: Negative.   Neurological: Negative.   Hematological: Negative.   Psychiatric/Behavioral: Negative.   All other systems reviewed and are negative.      Objective:    BP 118/62 mmHg  Pulse 65  Temp(Src) 97.6 F (36.4 C) (Oral)  Wt 203 lb 4 oz (92.194 kg)  SpO2 98%  LMP 05/16/2015  Wt Readings from Last 3 Encounters:  05/18/15 203 lb 4 oz (92.194 kg)  04/13/15 199 lb 12.8 oz (90.629 kg)  01/26/15 200 lb 12 oz (91.06 kg)    Physical Exam  Constitutional: She is oriented to person, place, and time. She appears well-developed and well-nourished. No distress.  HENT:  Head: Normocephalic and atraumatic.  Eyes: Conjunctivae are normal.  Cardiovascular: Normal rate.   Pulmonary/Chest: Effort normal.  Abdominal: Soft. She exhibits no distension. There is no tenderness.  Musculoskeletal: Normal range of motion.  Neurological: She is alert and oriented to person, place, and  time. No cranial nerve deficit.  Skin: Skin is warm and dry. She is not diaphoretic.  Psychiatric: She has a normal mood and affect. Her behavior is normal. Judgment and thought content normal.  Nursing note and vitals reviewed.         Assessment & Plan:   Anxiety and depression No Follow-up on file.

## 2015-05-18 NOTE — Patient Instructions (Addendum)
In order to wean off lexapro- 1 tablet every other day for 2 weeks and stops.  Please call me in a few with an update.

## 2015-05-18 NOTE — Assessment & Plan Note (Signed)
She feels symptoms are well controlled with psychotherapy. Given weaning instructions to wean off lexapro- see AVS for details. Continue psychotherapy. She will update me in a few weeks. The patient indicates understanding of these issues and agrees with the plan.

## 2015-05-18 NOTE — Assessment & Plan Note (Signed)
New- wet prep deferred since she is on her period. Not sexually active so reasonable to try oral diflucan since she has failed OTC monistat. eRx sent. Call or return to clinic prn if these symptoms worsen or fail to improve as anticipated. The patient indicates understanding of these issues and agrees with the plan.

## 2015-07-20 ENCOUNTER — Encounter: Payer: Self-pay | Admitting: Family Medicine

## 2015-07-20 ENCOUNTER — Ambulatory Visit (INDEPENDENT_AMBULATORY_CARE_PROVIDER_SITE_OTHER): Payer: 59 | Admitting: Family Medicine

## 2015-07-20 VITALS — BP 118/84 | HR 99 | Temp 98.2°F | Wt 199.8 lb

## 2015-07-20 DIAGNOSIS — J020 Streptococcal pharyngitis: Secondary | ICD-10-CM | POA: Diagnosis not present

## 2015-07-20 MED ORDER — DEXAMETHASONE SODIUM PHOSPHATE 10 MG/ML IJ SOLN
10.0000 mg | Freq: Once | INTRAMUSCULAR | Status: AC
  Administered 2015-07-20: 10 mg via INTRAMUSCULAR

## 2015-07-20 MED ORDER — PENICILLIN V POTASSIUM 500 MG PO TABS: 500.0000 mg | ORAL_TABLET | Freq: Three times a day (TID) | ORAL | Status: DC

## 2015-07-20 NOTE — Addendum Note (Signed)
Addended by: Desmond DikeKNIGHT, Massie Cogliano H on: 07/20/2015 12:04 PM   Modules accepted: Orders

## 2015-07-20 NOTE — Patient Instructions (Signed)
Good to see you. Please take penicillin as directed- 1 tablet three times daily for 10 days.  Please update me tomorrow with your symptoms.

## 2015-07-20 NOTE — Progress Notes (Signed)
SUBJECTIVE: 25 y.o. female with sore throat, myalgias, swollen glands, headache and fever for 3 days. No history of rheumatic fever. Other symptoms: swollen glands.  Pos sick contact with strep at work.  Current Outpatient Prescriptions on File Prior to Visit  Medication Sig Dispense Refill  . albuterol (PROVENTIL HFA;VENTOLIN HFA) 108 (90 BASE) MCG/ACT inhaler Inhale 2 puffs into the lungs every 4 (four) hours as needed for wheezing. 1 Inhaler 0  . escitalopram (LEXAPRO) 10 MG tablet Take 1 tablet (10 mg total) by mouth daily. 90 tablet 0   No current facility-administered medications on file prior to visit.    No Known Allergies  Past Medical History  Diagnosis Date  . Asthma   . Anxiety     Past Surgical History  Procedure Laterality Date  . Wisdom tooth extraction      Family History  Problem Relation Age of Onset  . Diabetes Maternal Grandmother   . Diabetes Paternal Grandmother     Social History   Social History  . Marital Status: Single    Spouse Name: N/A  . Number of Children: N/A  . Years of Education: N/A   Occupational History  . Not on file.   Social History Main Topics  . Smoking status: Never Smoker   . Smokeless tobacco: Never Used  . Alcohol Use: No  . Drug Use: No  . Sexual Activity: No   Other Topics Concern  . Not on file   Social History Narrative   The PMH, PSH, Social History, Family History, Medications, and allergies have been reviewed in [facility], and have been updated if relevant.  OBJECTIVE:  BP 118/84 mmHg  Pulse 99  Temp(Src) 98.2 F (36.8 C) (Oral)  Wt 199 lb 12 oz (90.606 kg)  SpO2 96%  LMP 07/08/2015  Vitals as noted above. Appears alert, well appearing, and in no distress and oriented to person, place, and time. Ears: bilateral TM's and external ear canals normal Oropharynx: erythematous and exudate noted Neck: supple, no significant adenopathy Lungs: clear to auscultation, no wheezes, rales or rhonchi, symmetric air  entry Rapid Strep test is negative  ASSESSMENT: Streptococcal pharyngitis- although rapid strep negative, symptoms and exam consistent with this.  PLAN: Per orders. IM decadron given degree of throat pain to decrease inflammation.  PCN 500 mg three times daily x 10 days. If trouble swallowing, she will update me immediately. Gargle, use acetaminophen or other OTC analgesic, and take Rx fully as prescribed. Call if other family members develop similar symptoms. See prn.

## 2015-07-20 NOTE — Progress Notes (Signed)
Pre visit review using our clinic review tool, if applicable. No additional management support is needed unless otherwise documented below in the visit note. 

## 2015-07-21 ENCOUNTER — Ambulatory Visit (INDEPENDENT_AMBULATORY_CARE_PROVIDER_SITE_OTHER): Payer: 59 | Admitting: *Deleted

## 2015-07-21 DIAGNOSIS — Z23 Encounter for immunization: Secondary | ICD-10-CM | POA: Diagnosis not present

## 2015-08-20 ENCOUNTER — Encounter: Payer: Self-pay | Admitting: Family Medicine

## 2015-08-20 ENCOUNTER — Ambulatory Visit (INDEPENDENT_AMBULATORY_CARE_PROVIDER_SITE_OTHER): Payer: 59 | Admitting: Family Medicine

## 2015-08-20 VITALS — BP 112/70 | HR 65 | Temp 97.4°F | Wt 190.8 lb

## 2015-08-20 DIAGNOSIS — F419 Anxiety disorder, unspecified: Principal | ICD-10-CM

## 2015-08-20 DIAGNOSIS — F418 Other specified anxiety disorders: Secondary | ICD-10-CM | POA: Diagnosis not present

## 2015-08-20 DIAGNOSIS — F329 Major depressive disorder, single episode, unspecified: Secondary | ICD-10-CM

## 2015-08-20 DIAGNOSIS — F32A Depression, unspecified: Secondary | ICD-10-CM

## 2015-08-20 MED ORDER — FLUOXETINE HCL 20 MG PO TABS: 20.0000 mg | ORAL_TABLET | Freq: Every day | ORAL | Status: DC

## 2015-08-20 NOTE — Progress Notes (Signed)
Subjective:   Patient ID: Heather Burns, female    DOB: 08/19/1990, 25 y.o.   MRN: [id number]  Heather Burns is a pleasant 25 y.o. year old female who presents to clinic today with Follow-up  on 08/20/2015  HPI:  Anxiety/depression- weaned herself off lexapro when I last saw her in 05/2015.  At that time, she felt her symptoms were much better controlled.  Had been taking Lexapro 10 mg daily and attending weekly psychotherapy with Berkley Harvey.     Anxiety and depression has deteriorated.  She is not sleeping well.  More panic attacks and anxious.  Wants to try a different antidepressant.  Felt lexapro caused weight gain.  Has lost weight since stopping it.  Wt Readings from Last 3 Encounters:  08/20/15 190 lb 12 oz (86.524 kg)  07/20/15 199 lb 12 oz (90.606 kg)  05/18/15 203 lb 4 oz (92.194 kg)   Previously tried zoloft which made her feel like she was in a tunnel.    Current Outpatient Prescriptions on File Prior to Visit  Medication Sig Dispense Refill  . albuterol (PROVENTIL HFA;VENTOLIN HFA) 108 (90 BASE) MCG/ACT inhaler Inhale 2 puffs into the lungs every 4 (four) hours as needed for wheezing. 1 Inhaler 0  . escitalopram (LEXAPRO) 10 MG tablet Take 1 tablet (10 mg total) by mouth daily. (Patient not taking: Reported on 08/20/2015) 90 tablet 0   No current facility-administered medications on file prior to visit.    No Known Allergies  Past Medical History  Diagnosis Date  . Asthma   . Anxiety     Past Surgical History  Procedure Laterality Date  . Wisdom tooth extraction      Family History  Problem Relation Age of Onset  . Diabetes Maternal Grandmother   . Diabetes Paternal Grandmother     Social History   Social History  . Marital Status: Single    Spouse Name: N/A  . Number of Children: N/A  . Years of Education: N/A   Occupational History  . Not on file.   Social History Main Topics  . Smoking status: Never Smoker   . Smokeless tobacco: Never Used   . Alcohol Use: No  . Drug Use: No  . Sexual Activity: No   Other Topics Concern  . Not on file   Social History Narrative   The PMH, PSH, Social History, Family History, Medications, and allergies have been reviewed in [facility], and have been updated if relevant.   Review of Systems  Constitutional: Negative for fever.  Eyes: Negative.   Respiratory: Negative.   Gastrointestinal: Negative for abdominal pain, blood in stool, abdominal distention and anal bleeding.  Endocrine: Negative.   Genitourinary: Negative for dysuria, vaginal discharge and vaginal pain.  Musculoskeletal: Negative.   Skin: Negative.   Neurological: Negative.   Hematological: Negative.   Psychiatric/Behavioral: Positive for sleep disturbance, dysphoric mood and decreased concentration. Negative for suicidal ideas and self-injury. The patient is nervous/anxious.   All other systems reviewed and are negative.      Objective:    BP 112/70 mmHg  Pulse 65  Temp(Src) 97.4 F (36.3 C) (Oral)  Wt 190 lb 12 oz (86.524 kg)  SpO2 99%  LMP 08/15/2015  Wt Readings from Last 3 Encounters:  08/20/15 190 lb 12 oz (86.524 kg)  07/20/15 199 lb 12 oz (90.606 kg)  05/18/15 203 lb 4 oz (92.194 kg)    Physical Exam  Constitutional: She is oriented to person, place, and time. She  appears well-developed and well-nourished. No distress.  HENT:  Head: Normocephalic and atraumatic.  Eyes: Conjunctivae are normal.  Cardiovascular: Normal rate.   Pulmonary/Chest: Effort normal.  Abdominal: Soft. She exhibits no distension. There is no tenderness.  Musculoskeletal: Normal range of motion.  Neurological: She is alert and oriented to person, place, and time. No cranial nerve deficit.  Skin: Skin is warm and dry. She is not diaphoretic.  Psychiatric: She has a normal mood and affect. Her behavior is normal. Judgment and thought content normal.  Nursing note and vitals reviewed.         Assessment & Plan:   Anxiety and  depression No Follow-up on file.

## 2015-08-20 NOTE — Progress Notes (Signed)
Pre visit review using our clinic review tool, if applicable. No additional management support is needed unless otherwise documented below in the visit note. 

## 2015-08-20 NOTE — Assessment & Plan Note (Signed)
Deteriorated. >25 minutes spent in face to face time with patient, >50% spent in counselling or coordination of care Discussed rxs. She is willing to try prozac. eRx sent for prozac 20 mg daily. She will update me in a few weeks.

## 2015-08-20 NOTE — Patient Instructions (Signed)
Great to see you. We are starting prozac 20 mg daily- ok to break in 1/2 for a week or so if you want.  Please keep me updated.

## 2015-08-24 ENCOUNTER — Ambulatory Visit: Payer: 59 | Admitting: Family Medicine

## 2015-09-14 ENCOUNTER — Telehealth: Payer: Self-pay | Admitting: Family Medicine

## 2015-09-14 ENCOUNTER — Ambulatory Visit (INDEPENDENT_AMBULATORY_CARE_PROVIDER_SITE_OTHER): Payer: 59 | Admitting: Family Medicine

## 2015-09-14 VITALS — BP 122/82 | HR 61 | Temp 97.8°F | Wt 190.8 lb

## 2015-09-14 DIAGNOSIS — F419 Anxiety disorder, unspecified: Principal | ICD-10-CM

## 2015-09-14 DIAGNOSIS — F32A Depression, unspecified: Secondary | ICD-10-CM

## 2015-09-14 DIAGNOSIS — F418 Other specified anxiety disorders: Secondary | ICD-10-CM

## 2015-09-14 DIAGNOSIS — F329 Major depressive disorder, single episode, unspecified: Secondary | ICD-10-CM

## 2015-09-14 NOTE — Assessment & Plan Note (Signed)
>  25 minutes spent in face to face time with patient, >50% spent in counselling or coordination of care Remains poorly controlled after trying severe different rxs. Refer to psychiatry to help with medication management. Continue psychotherapy. The patient indicates understanding of these issues and agrees with the plan.  Orders Placed This Encounter  Procedures  . Ambulatory referral to Psychiatry

## 2015-09-14 NOTE — Telephone Encounter (Signed)
Pt placed on [facility] WQ. Pt is aware they will call to schedule. Pt is also aware she can call and schedule since they have referral in [facility].

## 2015-09-14 NOTE — Progress Notes (Signed)
Subjective:   Patient ID: Heather Burns, female    DOB: 07/23/1990, 25 y.o.   MRN: [id number]  Heather Burns is a pleasant 25 y.o. year old female who presents to clinic today with her sister for Follow-up  on 09/14/2015  HPI:  Anxiety/depression- weaned herself off lexapro when I saw her in 05/2015.  At that time, she felt her symptoms were much better controlled.  Had been taking Lexapro 10 mg daily and attending weekly psychotherapy with Berkley HarveyLaurie Woodard.   When I saw her last month, symptoms of anxiety and depression had deteriorated.  She was not sleeping well and having more panic attacks and anxiety.  Wanted to try a different antidepressant.  Felt lexapro caused weight gain and had lost weight since stopping it.  Wt Readings from Last 3 Encounters:  09/14/15 190 lb 12 oz (86.524 kg)  08/20/15 190 lb 12 oz (86.524 kg)  07/20/15 199 lb 12 oz (90.606 kg)   Previously tried zoloft which made her feel like she was in a tunnel.  We therefore started her on prozac 20 mg daily.  She is here to follow this up today.  She feels prozac is not working well either.  Still very anxious and tearful.  No SI or HI.  Her sister is moving across the country tomorrow and they both feel this is worsening her symptoms.  She continues with psychotherapy which has been helpful.   Current Outpatient Prescriptions on File Prior to Visit  Medication Sig Dispense Refill  . albuterol (PROVENTIL HFA;VENTOLIN HFA) 108 (90 BASE) MCG/ACT inhaler Inhale 2 puffs into the lungs every 4 (four) hours as needed for wheezing. 1 Inhaler 0  . FLUoxetine (PROZAC) 20 MG tablet Take 1 tablet (20 mg total) by mouth daily. 30 tablet 3   No current facility-administered medications on file prior to visit.    No Known Allergies  Past Medical History  Diagnosis Date  . Asthma   . Anxiety     Past Surgical History  Procedure Laterality Date  . Wisdom tooth extraction      Family History  Problem Relation Age of Onset    . Diabetes Maternal Grandmother   . Diabetes Paternal Grandmother     Social History   Social History  . Marital Status: Single    Spouse Name: N/A  . Number of Children: N/A  . Years of Education: N/A   Occupational History  . Not on file.   Social History Main Topics  . Smoking status: Never Smoker   . Smokeless tobacco: Never Used  . Alcohol Use: No  . Drug Use: No  . Sexual Activity: No   Other Topics Concern  . Not on file   Social History Narrative   The PMH, PSH, Social History, Family History, Medications, and allergies [facility], and have been updated if relevant.   Review of Systems  Constitutional: Negative for fever.  Eyes: Negative.   Respiratory: Negative.   Gastrointestinal: Negative for abdominal pain, blood in stool, abdominal distention and anal bleeding.  Endocrine: Negative.   Genitourinary: Negative for dysuria, vaginal discharge and vaginal pain.  Musculoskeletal: Negative.   Skin: Negative.   Neurological: Negative.   Hematological: Negative.   Psychiatric/Behavioral: Positive for sleep disturbance, dysphoric mood and decreased concentration. Negative for suicidal ideas and self-injury. The patient is nervous/anxious.   All other systems reviewed and are negative.      Objective:    BP 122/82 mmHg  Pulse  61  Temp(Src) 97.8 F (36.6 C) (Oral)  Wt 190 lb 12 oz (86.524 kg)  SpO2 98%  LMP 09/11/2015  Wt Readings from Last 3 Encounters:  09/14/15 190 lb 12 oz (86.524 kg)  08/20/15 190 lb 12 oz (86.524 kg)  07/20/15 199 lb 12 oz (90.606 kg)    Physical Exam  Constitutional: She is oriented to person, place, and time. She appears well-developed and well-nourished. No distress.  HENT:  Head: Normocephalic and atraumatic.  Eyes: Conjunctivae are normal.  Cardiovascular: Normal rate.   Pulmonary/Chest: Effort normal.  Abdominal: Soft. She exhibits no distension. There is no tenderness.  Musculoskeletal: Normal range  of motion.  Neurological: She is alert and oriented to person, place, and time. No cranial nerve deficit.  Skin: Skin is warm and dry. She is not diaphoretic.  Psychiatric: She has a normal mood and affect. Her behavior is normal. Judgment and thought content normal.  Nursing note and vitals reviewed.         Assessment & Plan:   Anxiety and depression - Plan: Ambulatory referral to Psychiatry No Follow-up on file.

## 2015-09-14 NOTE — Progress Notes (Signed)
Pre visit review using our clinic review tool, if applicable. No additional management support is needed unless otherwise documented below in the visit note. 

## 2015-09-14 NOTE — Patient Instructions (Signed)
Great to see you. Please stop by to see Allison on your way out. 

## 2015-10-19 ENCOUNTER — Ambulatory Visit: Payer: 59 | Admitting: Psychiatry

## 2015-10-30 ENCOUNTER — Other Ambulatory Visit: Payer: Self-pay | Admitting: *Deleted

## 2015-10-30 MED ORDER — FLUOXETINE HCL 20 MG PO TABS: 20.0000 mg | ORAL_TABLET | Freq: Every day | ORAL | 0 refills | Status: DC

## 2015-11-04 ENCOUNTER — Ambulatory Visit (INDEPENDENT_AMBULATORY_CARE_PROVIDER_SITE_OTHER): Payer: 59 | Admitting: Psychiatry

## 2015-11-04 ENCOUNTER — Encounter: Payer: Self-pay | Admitting: Psychiatry

## 2015-11-04 VITALS — BP 147/90 | HR 116 | Temp 97.9°F | Ht 64.0 in | Wt 193.8 lb

## 2015-11-04 DIAGNOSIS — F331 Major depressive disorder, recurrent, moderate: Secondary | ICD-10-CM

## 2015-11-04 MED ORDER — VENLAFAXINE HCL ER 75 MG PO CP24: 75.0000 mg | ORAL_CAPSULE | Freq: Every day | ORAL | 0 refills | Status: DC

## 2015-11-04 NOTE — Progress Notes (Signed)
Psychiatric Initial Adult Assessment   Patient Identification: Heather Burns MRN:  [id number] Date of Evaluation:  11/04/2015 Referral Source: [facility] Chief Complaint:   Chief Complaint    Establish Care; Anxiety; Depression; Panic Attack; Headache; Stress; Fatigue     Visit Diagnosis:    ICD-9-CM ICD-10-CM   1. MDD (major depressive disorder), recurrent episode, moderate (HCC) 296.32 F33.1     History of Present Illness:    Patient is a 25 year old single white female who presented for initial assessment accompanied by her father. She was referred by her primary care physician Dr. Alfonzo Beers. Patient reported that she has history of depression and anxiety and was getting treatment by her PCP. She has tried Lexapro and Prozac in the past. Patient reported that she responded well to the Lexapro and took the medication for approximately one year. It was in combination with the therapy. She was doing well on the medication. However she stopped taking the medication as she was doing well. Her symptoms returned and she was tried on the Prozac which caused worsening of her symptoms. Patient reported several stressors including the recent separation of her parents last year after 31 years of marriage. Her mother has moved to [place] and her sister has also relocated to not to go to with her husband. She reported that she was very close to her mother and her sister. Pt was sad and tearful during the interview. She reported that she spends most of the time at home. She does not go out and after coming back from work she spends most of the time at home. Her father was trying to be supportive of her. She reported that she is unable to exercise due to history of tendinitis in her ankle.  She has a Veterinary surgeon in [place] and she reported that she did well when she was seeing a counselor in the past. Patient currently denied having any perceptual disturbances. She denied having any suicidal  ideations or plans.  Associated Signs/Symptoms: Depression Symptoms:  depressed mood, psychomotor retardation, fatigue, feelings of worthlessness/guilt, hopelessness, anxiety, disturbed sleep, weight gain, (Hypo) Manic Symptoms:  denied Anxiety Symptoms:  Excessive Worry, Psychotic Symptoms:  denied PTSD Symptoms: Negative NA  Past Psychiatric History:  She was treated for depression and anxiety by her primary care physician Dr. Alfonzo Beers. She reported that her anxiety symptoms deteriorated and she was not sleeping well and responded well to the Lexapro. Patient stopped the medication as she was feeling better. She has also gained weight on the Lexapro. She responded poorly to the Prozac Not have any history of suicide attempts in the past   Previous Psychotropic Medications:Lexapro Prozac Lexapro  Substance Abuse History in the last 12 months:  No.  Consequences of Substance Abuse: Negative NA  Past Medical History:  Past Medical History:  Diagnosis Date  . Anxiety   . Asthma     Past Surgical History:  Procedure Laterality Date  . WISDOM TOOTH EXTRACTION      Family Psychiatric History:   Grandmother- depression Mother - anxiety, ambien and [place]  Family History:  Family History  Problem Relation Age of Onset  . Diabetes Maternal Grandmother   . Diabetes Paternal Grandmother     Social History:   Social History   Social History  . Marital status: Single    Spouse name: N/A  . Number of children: N/A  . Years of education: N/A   Social History Main Topics  . Smoking status: Never Smoker  .  Smokeless tobacco: Never Used  . Alcohol use No  . Drug use: No  . Sexual activity: Not Currently   Other Topics Concern  . None   Social History Narrative  . None    Additional Social History:   Patient currently lives with her father and brother. She works in a childcare for the past 3 years  Allergies:  No Known Allergies  Metabolic Disorder  Labs: No results found for: HGBA1C, MPG No results found for: PROLACTIN No results found for: CHOL, TRIG, HDL, CHOLHDL, VLDL, LDLCALC   Current Medications: Current Outpatient Prescriptions  Medication Sig Dispense Refill  . albuterol (PROVENTIL HFA;VENTOLIN HFA) 108 (90 BASE) MCG/ACT inhaler Inhale 2 puffs into the lungs every 4 (four) hours as needed for wheezing. 1 Inhaler 0  . etodolac (LODINE) 500 MG tablet Take by mouth.    Marland Kitchen. FLUoxetine (PROZAC) 20 MG tablet Take 1 tablet (20 mg total) by mouth daily. 30 tablet 0   No current facility-administered medications for this visit.     Neurologic: Headache: No Seizure: No Paresthesias:No  Musculoskeletal: Strength & Muscle Tone: within normal limits Gait & Station: normal Patient leans: N/A  Psychiatric Specialty Exam: ROS  Blood pressure (!) 147/90, pulse (!) 116, temperature 97.9 F (36.6 C), temperature source Oral, height 5\' 4"  (1.626 m), weight 193 lb 12.8 oz (87.9 kg), last menstrual period 10/07/2015.Body mass index is 33.27 kg/m.  General Appearance: Casual  Eye Contact:  Fair  Speech:  Clear and Coherent  Volume:  Decreased  Mood:  Anxious and Depressed  Affect:  Congruent  Thought Process:  Goal Directed  Orientation:  Full (Time, Place, and Person)  Thought Content:  Logical  Suicidal Thoughts:  No  Homicidal Thoughts:  No  Memory:  Immediate;   Fair Recent;   Fair Remote;   Fair  Judgement:  Intact  Insight:  Fair  Psychomotor Activity:  Normal  Concentration:  Concentration: Fair and Attention Span: Fair  Recall:  Fis[employer]Fair  Fund of Knowledge:Fair  Language: Fair  Akathisia:  No  Handed:  Right  AIMS (if indicated):    Assets:  Communication Skills Desire for Improvement Physical Health Social Support Transportation  ADL's:  Intact  Cognition: WNL  Sleep:  good    Treatment Plan Summary: Medication management   Discussed with patient and her father about the medications treatment risks  benefits and alternatives.  She agreed to a trial of Effexor XR 75 mg in the morning. She will continue the therapy with her therapist  Follow-up in one month or earlier depending on her symptoms   More than 50% of the time spent in psychoeducation, counseling and coordination of care.    This note was generated in part or whole with voice recognition software. Voice regonition is usually quite accurate but there are transcription errors that can and very often do occur. I apologize for any typographical errors that were not detected and corrected.    Brandy HaleUzma Brittain Smithey, MD 8/30/20173:14 PM

## 2015-11-16 ENCOUNTER — Other Ambulatory Visit: Payer: Self-pay | Admitting: *Deleted

## 2015-11-16 NOTE — Telephone Encounter (Signed)
Received refill request from pharmacy for 90 day rx of Prozac. Pt was recently seen by psych and it was documented that pt did poorly while on Prozac. I called pt and lmom on her cell for her to return call from office. Need to clarify what she is taking.

## 2015-11-17 NOTE — Telephone Encounter (Signed)
LM on pts vm requesting a call back 

## 2015-11-24 ENCOUNTER — Ambulatory Visit (INDEPENDENT_AMBULATORY_CARE_PROVIDER_SITE_OTHER): Payer: 59

## 2015-11-24 DIAGNOSIS — Z23 Encounter for immunization: Secondary | ICD-10-CM

## 2015-12-03 ENCOUNTER — Ambulatory Visit: Payer: 59 | Admitting: Psychiatry

## 2015-12-04 ENCOUNTER — Telehealth: Payer: Self-pay

## 2015-12-04 NOTE — Telephone Encounter (Signed)
left [facility] that it was ok for another refill.

## 2015-12-04 NOTE — Telephone Encounter (Signed)
pt called stated that she has an appt on  10-13-*17 and she needs another refill on her medication venlafaxine xr 75mg  because she will not have enough to last until her appt.

## 2015-12-18 ENCOUNTER — Ambulatory Visit (INDEPENDENT_AMBULATORY_CARE_PROVIDER_SITE_OTHER): Payer: 59 | Admitting: Psychiatry

## 2015-12-18 ENCOUNTER — Encounter: Payer: Self-pay | Admitting: Psychiatry

## 2015-12-18 VITALS — BP 133/87 | HR 111 | Temp 98.6°F | Wt 194.0 lb

## 2015-12-18 DIAGNOSIS — F39 Unspecified mood [affective] disorder: Secondary | ICD-10-CM

## 2015-12-18 MED ORDER — VENLAFAXINE HCL ER 37.5 MG PO CP24: ORAL_CAPSULE | ORAL | 1 refills | Status: DC

## 2015-12-18 NOTE — Progress Notes (Signed)
Psychiatric MD Progress Note   Patient Identification: Heather Burns MRN:  [id number] Date of Evaluation:  12/18/2015 [facility] Chief Complaint:    Visit Diagnosis:    ICD-9-CM ICD-10-CM   1. Episodic mood disorder (HCC) 296.90 F39     History of Present Illness:    Patient is a 25 year old single white female who presented for follow up.  She was referred by her primary care physician Dr. Jovita Gammaalia . Patient reported that she has history of depression and anxiety and was getting treatment by her PCP. She has tried Lexapro and Prozac in the past. Patient reported that she Has started feeling better since she was started on the Effexor. She reported that her family members have also noticed improvement in her symptoms. She feels that she is more calm and alert in the morning hours but she feels more anxious during the evening. She is not having these other side effects of the medication. Occasionally she will notice that her blood pressure is elevated.  Her panic attacks are also decreasing. Patient currently denied having any suicidal ideations or plans. She has good relationship with her boyfriend.   She is planning to celebrate the birthday of her grandmother over the weekend. She was receptive to the medication adjustment at this time.    She denied having any suicidal ideations or plans.  Associated Signs/Symptoms: Depression Symptoms:  depressed mood, fatigue, hopelessness, anxiety, disturbed sleep, weight gain, (Hypo) Manic Symptoms:  denied Anxiety Symptoms:  Excessive Worry, Psychotic Symptoms:  denied PTSD Symptoms: Negative NA  Past Psychiatric History:  She was treated for depression and anxiety by her primary care physician Dr. Alfonzo Beershalia. She reported that her anxiety symptoms deteriorated and she was not sleeping well and responded well to the Lexapro. Patient stopped the medication as she was feeling better. She has also gained weight on the  Lexapro. She responded poorly to the Prozac Not have any history of suicide attempts in the past   Previous Psychotropic Medications:Lexapro Prozac Lexapro  Substance Abuse History in the last 12 months:  No.  Consequences of Substance Abuse: Negative NA  Past Medical History:  Past Medical History:  Diagnosis Date  . Anxiety   . Asthma     Past Surgical History:  Procedure Laterality Date  . WISDOM TOOTH EXTRACTION      Family Psychiatric History:   Grandmother- depression Mother - anxiety, ambien and [place]  Family History:  Family History  Problem Relation Age of Onset  . Diabetes Maternal Grandmother   . Diabetes Paternal Grandmother     Social History:   Social History   Social History  . Marital status: Single    Spouse name: N/A  . Number of children: N/A  . Years of education: N/A   Social History Main Topics  . Smoking status: Never Smoker  . Smokeless tobacco: Never Used  . Alcohol use No  . Drug use: No  . Sexual activity: Not Currently   Other Topics Concern  . None   Social History Narrative  . None    Additional Social History:   Patient currently lives with her father and brother. She works in a childcare for the past 3 years  Allergies:  No Known Allergies  Metabolic Disorder Labs: No results found for: HGBA1C, MPG No results found for: PROLACTIN No results found for: CHOL, TRIG, HDL, CHOLHDL, VLDL, LDLCALC   Current Medications: Current Outpatient Prescriptions  Medication Sig Dispense Refill  . albuterol (PROVENTIL HFA;VENTOLIN  HFA) 108 (90 BASE) MCG/ACT inhaler Inhale 2 puffs into the lungs every 4 (four) hours as needed for wheezing. 1 Inhaler 0  . venlafaxine XR (EFFEXOR XR) 75 MG 24 hr capsule Take 1 capsule (75 mg total) by mouth daily with breakfast. 30 capsule 0   No current facility-administered medications for this visit.     Neurologic: Headache: No Seizure:  No Paresthesias:No  Musculoskeletal: Strength & Muscle Tone: within normal limits Gait & Station: normal Patient leans: N/A  Psychiatric Specialty Exam: ROS  Blood pressure 133/87, pulse (!) 111, temperature 98.6 F (37 C), temperature source Oral, weight 194 lb (88 kg).Body mass index is 33.3 kg/m.  General Appearance: Casual  Eye Contact:  Fair  Speech:  Clear and Coherent  Volume:  Decreased  Mood:  Anxious and Depressed  Affect:  Congruent  Thought Process:  Goal Directed  Orientation:  Full (Time, Place, and Person)  Thought Content:  Logical  Suicidal Thoughts:  No  Homicidal Thoughts:  No  Memory:  Immediate;   Fair Recent;   Fair Remote;   Fair  Judgement:  Intact  Insight:  Fair  Psychomotor Activity:  Normal  Concentration:  Concentration: Fair and Attention Span: Fair  Recall:  [employer] of Knowledge:Fair  Language: Fair  Akathisia:  No  Handed:  Right  AIMS (if indicated):    Assets:  Communication Skills Desire for Improvement Physical Health Social Support Transportation  ADL's:  Intact  Cognition: WNL  Sleep:  good    Treatment Plan Summary: Medication management   Discussed with patient  about the medications treatment risks benefits and alternatives.  She agreed to a trial of Effexor XR 75 mg in the morning And 37.5 mg in the evening She will continue the therapy with her therapist  Follow-up in one month or earlier depending on her symptoms   More than 50% of the time spent in psychoeducation, counseling and coordination of care.    This note was generated in part or whole with voice recognition software. Voice regonition is usually quite accurate but there are transcription errors that can and very often do occur. I apologize for any typographical errors that were not detected and corrected.    Brandy Hale, MD 10/13/201710:56 AM

## 2016-01-15 ENCOUNTER — Ambulatory Visit: Payer: 59 | Admitting: Psychiatry

## 2016-01-22 ENCOUNTER — Ambulatory Visit: Payer: 59 | Admitting: Psychiatry

## 2016-01-25 ENCOUNTER — Ambulatory Visit (INDEPENDENT_AMBULATORY_CARE_PROVIDER_SITE_OTHER): Payer: 59 | Admitting: Internal Medicine

## 2016-01-25 ENCOUNTER — Encounter: Payer: Self-pay | Admitting: Internal Medicine

## 2016-01-25 VITALS — BP 124/84 | HR 109 | Temp 98.5°F | Resp 12 | Wt 190.0 lb

## 2016-01-25 DIAGNOSIS — J069 Acute upper respiratory infection, unspecified: Secondary | ICD-10-CM

## 2016-01-25 DIAGNOSIS — B9789 Other viral agents as the cause of diseases classified elsewhere: Secondary | ICD-10-CM | POA: Diagnosis not present

## 2016-01-25 MED ORDER — HYDROCODONE-HOMATROPINE 5-1.5 MG/5ML PO SYRP: 5.0000 mL | ORAL_SOLUTION | Freq: Every evening | ORAL | 0 refills | Status: DC | PRN

## 2016-01-25 NOTE — Progress Notes (Signed)
   Subjective:    Patient ID: Heather ParsonsLauren Burns, female    DOB: 01/27/1991, 25 y.o.   MRN: [id number]  HPI Here due to   Started with sore throat 3 days ago Then cough, chills and aches Went to urgent care on Saturday---she worries about it "turning into something worse" Felt it was in her chest Some better yesterday--worse last night (up all night with cough) Cough med didn't help at all  No fever Some chills and feels achy Everything in head hurts and glands in neck Sore throat is better Some ear pain Coughing up gray, nasty mucus. All day  Tried theraflu at first Using ibuprofen 400mg --- seemed to help some.  Current Outpatient Prescriptions on File Prior to Visit  Medication Sig Dispense Refill  . albuterol (PROVENTIL HFA;VENTOLIN HFA) 108 (90 BASE) MCG/ACT inhaler Inhale 2 puffs into the lungs every 4 (four) hours as needed for wheezing. 1 Inhaler 0  . venlafaxine XR (EFFEXOR-XR) 37.5 MG 24 hr capsule 2 pills in am and 1 pill qpm 90 capsule 1   No current facility-administered medications on file prior to visit.     No Known Allergies  Past Medical History:  Diagnosis Date  . Anxiety   . Asthma     Past Surgical History:  Procedure Laterality Date  . WISDOM TOOTH EXTRACTION      Family History  Problem Relation Age of Onset  . Diabetes Maternal Grandmother   . Diabetes Paternal Grandmother     Social History   Social History  . Marital status: Single    Spouse name: N/A  . Number of children: N/A  . Years of education: N/A   Occupational History  . Not on file.   Social History Main Topics  . Smoking status: Never Smoker  . Smokeless tobacco: Never Used  . Alcohol use No  . Drug use: No  . Sexual activity: Not Currently   Other Topics Concern  . Not on file   Social History Narrative  . No narrative on file   Review of Systems No rash No diarrhea Nausea and some vomiting 2 days ago--relates to the cough and mucus    Objective:   Physical  Exam  Constitutional: She appears well-developed and well-nourished. No distress.  HENT:  Mouth/Throat: Oropharynx is clear and moist. No oropharyngeal exudate.  Mild sinus tenderness and nasal congestion TMs normal   Neck: Normal range of motion. Neck supple.  Pulmonary/Chest: Effort normal and breath sounds normal. No respiratory distress. She has no wheezes. She has no rales.  Lymphadenopathy:    She has no cervical adenopathy.  Skin: No rash noted.          Assessment & Plan:

## 2016-01-25 NOTE — Progress Notes (Signed)
Pre visit review using our clinic review tool, if applicable. No additional management support is needed unless otherwise documented below in the visit note. 

## 2016-01-25 NOTE — Patient Instructions (Signed)
Please increase the ibuprofen to 3 of the 200mg  pills (600mg ) up to every 4 hours (4 doses a day).

## 2016-01-25 NOTE — Assessment & Plan Note (Signed)
Doesn't seem severe enough for flu--but classic viral symptoms Discussed increased analgesics Will give narcotic cough syrup for bedtime Consider empiric antibiotic if worsening next week

## 2016-02-26 ENCOUNTER — Ambulatory Visit: Payer: 59 | Admitting: Adult Health

## 2016-05-11 ENCOUNTER — Ambulatory Visit (INDEPENDENT_AMBULATORY_CARE_PROVIDER_SITE_OTHER): Payer: BLUE CROSS/BLUE SHIELD | Admitting: Internal Medicine

## 2016-05-11 ENCOUNTER — Encounter: Payer: Self-pay | Admitting: Internal Medicine

## 2016-05-11 VITALS — BP 126/84 | HR 87 | Temp 97.4°F | Wt 172.2 lb

## 2016-05-11 DIAGNOSIS — R1011 Right upper quadrant pain: Secondary | ICD-10-CM

## 2016-05-11 DIAGNOSIS — R899 Unspecified abnormal finding in specimens from other organs, systems and tissues: Secondary | ICD-10-CM

## 2016-05-11 DIAGNOSIS — R1013 Epigastric pain: Secondary | ICD-10-CM

## 2016-05-11 LAB — POC URINALSYSI DIPSTICK (AUTOMATED)
Bilirubin, UA: NEGATIVE
Glucose, UA: NEGATIVE
Ketones, UA: NEGATIVE
Nitrite, UA: NEGATIVE
PROTEIN UA: NEGATIVE
SPEC GRAV UA: 1.015
Urobilinogen, UA: NEGATIVE
pH, UA: 6

## 2016-05-11 MED ORDER — CIPROFLOXACIN HCL 500 MG PO TABS: 500.0000 mg | ORAL_TABLET | Freq: Two times a day (BID) | ORAL | 0 refills | Status: DC

## 2016-05-11 NOTE — Addendum Note (Signed)
Addended by: Roena MaladyEVONTENNO, Penina Reisner Y on: 05/11/2016 05:08 PM   Modules accepted: Orders

## 2016-05-11 NOTE — Patient Instructions (Signed)

## 2016-05-11 NOTE — Progress Notes (Signed)
   Subjective:    Patient ID: Heather ParsonsLauren Burns, female    DOB: 06/07/1990, 26 y.o.   MRN: [id number]  HPI  Pt presents to the clinic today with c/o abdominal pain. This started 2 days ago. She describes the pain as sharp and cramping. The pain radiates to her umbilical region. She has had some mild nausea and vomited x 1. She denies fever, chills or body. Her last BM was today with the use of laxatives. Prior to that she has not had a BM in 3 days which is unusual for her. She denies blood in her stool. She denies urinary complaints. She denies changes in diet or medication. She started her period last Friday.  Review of Systems      Past Medical History:  Diagnosis Date  . Anxiety   . Asthma     Current Outpatient Prescriptions  Medication Sig Dispense Refill  . albuterol (PROVENTIL HFA;VENTOLIN HFA) 108 (90 BASE) MCG/ACT inhaler Inhale 2 puffs into the lungs every 4 (four) hours as needed for wheezing. 1 Inhaler 0  . venlafaxine XR (EFFEXOR-XR) 37.5 MG 24 hr capsule 2 pills in am and 1 pill qpm 90 capsule 1   No current facility-administered medications for this visit.     No Known Allergies  Family History  Problem Relation Age of Onset  . Diabetes Maternal Grandmother   . Diabetes Paternal Grandmother     Social History   Social History  . Marital status: Single    Spouse name: N/A  . Number of children: N/A  . Years of education: N/A   Occupational History  . Not on file.   Social History Main Topics  . Smoking status: Never Smoker  . Smokeless tobacco: Never Used  . Alcohol use No  . Drug use: No  . Sexual activity: Not Currently   Other Topics Concern  . Not on file   Social History Narrative  . No narrative on file     Constitutional: Denies fever, malaise, fatigue, headache or abrupt weight changes.  Gastrointestinal: Pt reports abdominal pain and constipation. Denies bloating, diarrhea or blood in the stool.  GU: Denies urgency, frequency, pain with  urination, burning sensation, blood in urine, odor or discharge.  No other specific complaints in a complete review of systems (except as listed in HPI above).  Objective:   Physical Exam  BP 126/84   Pulse 87   Temp 97.4 F (36.3 C) (Oral)   Wt 172 lb 4 oz (78.1 kg)   LMP 05/06/2016   SpO2 98%   BMI 29.57 kg/m  Wt Readings from Last 3 Encounters:  05/11/16 172 lb 4 oz (78.1 kg)  01/25/16 190 lb (86.2 kg)  09/14/15 190 lb 12 oz (86.5 kg)    General: Appears herstated age, well developed, well nourished in NAD. Abdomen: Soft and tender in the RUQ, epigastric area. Hypoactive bowel sounds. No distention or masses noted.         Assessment & Plan:   RUQ and epigastric pain:  Likely due to constipation Advised her to start Mirilax and Align daily Increase her water intake Will check CBC, CMET, amylase and lipase today  RTC as needed or if symptoms persist or worsen BAITY, REGINA, NP d

## 2016-05-12 LAB — CBC
HCT: 40.9 % (ref 36.0–46.0)
Hemoglobin: 13.6 g/dL (ref 12.0–15.0)
MCHC: 33.3 g/dL (ref 30.0–36.0)
MCV: 88.2 fl (ref 78.0–100.0)
PLATELETS: 274 10*3/uL (ref 150.0–400.0)
RBC: 4.64 Mil/uL (ref 3.87–5.11)
RDW: 14.2 % (ref 11.5–15.5)
WBC: 7.1 10*3/uL (ref 4.0–10.5)

## 2016-05-12 LAB — COMPREHENSIVE METABOLIC PANEL
ALT: 12 U/L (ref 0–35)
AST: 14 U/L (ref 0–37)
Albumin: 4.3 g/dL (ref 3.5–5.2)
Alkaline Phosphatase: 83 U/L (ref 39–117)
BUN: 13 mg/dL (ref 6–23)
CALCIUM: 9.8 mg/dL (ref 8.4–10.5)
CHLORIDE: 102 meq/L (ref 96–112)
CO2: 28 meq/L (ref 19–32)
CREATININE: 1.43 mg/dL — AB (ref 0.40–1.20)
GFR: 47.49 mL/min — ABNORMAL LOW (ref >60.00)
GLUCOSE: 92 mg/dL (ref 70–99)
Potassium: 4.2 mEq/L (ref 3.5–5.1)
SODIUM: 136 meq/L (ref 135–145)
Total Bilirubin: 0.4 mg/dL (ref 0.2–1.2)
Total Protein: 7.8 g/dL (ref 6.0–8.3)

## 2016-05-12 LAB — AMYLASE: Amylase: 33 U/L (ref 27–131)

## 2016-05-12 LAB — LIPASE: LIPASE: 10 U/L — AB (ref 11.0–59.0)

## 2016-05-12 LAB — URINE CULTURE

## 2016-05-13 NOTE — Addendum Note (Signed)
Addended by: Roena MaladyEVONTENNO, MELANIE Y on: 05/13/2016 02:51 PM   Modules accepted: Orders

## 2016-05-19 ENCOUNTER — Other Ambulatory Visit (INDEPENDENT_AMBULATORY_CARE_PROVIDER_SITE_OTHER): Payer: BLUE CROSS/BLUE SHIELD

## 2016-05-19 ENCOUNTER — Other Ambulatory Visit: Payer: 59

## 2016-05-19 DIAGNOSIS — R899 Unspecified abnormal finding in specimens from other organs, systems and tissues: Secondary | ICD-10-CM

## 2016-05-19 LAB — COMPREHENSIVE METABOLIC PANEL
ALBUMIN: 3.8 g/dL (ref 3.5–5.2)
ALT: 10 U/L (ref 0–35)
AST: 10 U/L (ref 0–37)
Alkaline Phosphatase: 75 U/L (ref 39–117)
BUN: 9 mg/dL (ref 6–23)
CHLORIDE: 105 meq/L (ref 96–112)
CO2: 27 meq/L (ref 19–32)
Calcium: 9.5 mg/dL (ref 8.4–10.5)
Creatinine, Ser: 1.02 mg/dL (ref 0.40–1.20)
GFR: 70.13 mL/min (ref >60.00)
Glucose, Bld: 102 mg/dL — ABNORMAL HIGH (ref 70–99)
POTASSIUM: 3.9 meq/L (ref 3.5–5.1)
SODIUM: 140 meq/L (ref 135–145)
Total Bilirubin: 0.3 mg/dL (ref 0.2–1.2)
Total Protein: 6.9 g/dL (ref 6.0–8.3)

## 2016-06-08 ENCOUNTER — Other Ambulatory Visit: Payer: Self-pay

## 2016-06-08 MED ORDER — ALBUTEROL SULFATE HFA 108 (90 BASE) MCG/ACT IN AERS: 2.0000 | INHALATION_SPRAY | RESPIRATORY_TRACT | 0 refills | Status: AC | PRN

## 2016-06-08 NOTE — Telephone Encounter (Signed)
Left v/m advising pt refill done as requested and to ck with [facility].

## 2016-06-08 NOTE — Telephone Encounter (Signed)
Pt left v/m; pt cannot find her inhaler(last filled 11/24/14). Pt request refill albuterol inhaler to [facility]. Please advise.

## 2016-06-15 ENCOUNTER — Other Ambulatory Visit: Payer: Self-pay | Admitting: Urology

## 2016-06-15 ENCOUNTER — Emergency Department
Admission: EM | Admit: 2016-06-15 | Discharge: 2016-06-15 | Disposition: A | Discharge status: Home / Self Care | Payer: BLUE CROSS/BLUE SHIELD | Attending: Emergency Medicine | Admitting: Emergency Medicine

## 2016-06-15 ENCOUNTER — Emergency Department: Payer: BLUE CROSS/BLUE SHIELD

## 2016-06-15 DIAGNOSIS — J45909 Unspecified asthma, uncomplicated: Secondary | ICD-10-CM | POA: Diagnosis not present

## 2016-06-15 DIAGNOSIS — N200 Calculus of kidney: Secondary | ICD-10-CM | POA: Diagnosis not present

## 2016-06-15 DIAGNOSIS — R109 Unspecified abdominal pain: Secondary | ICD-10-CM | POA: Diagnosis present

## 2016-06-15 LAB — URINALYSIS, ROUTINE W REFLEX MICROSCOPIC: SPECIFIC GRAVITY, URINE: 1.025 (ref 1.005–1.030)

## 2016-06-15 LAB — BASIC METABOLIC PANEL
ANION GAP: 7 (ref 5–15)
BUN: 13 mg/dL (ref 6–20)
CO2: 25 mmol/L (ref 22–32)
Calcium: 9.5 mg/dL (ref 8.9–10.3)
Chloride: 106 mmol/L (ref 101–111)
Creatinine, Ser: 0.9 mg/dL (ref 0.44–1.00)
Glucose, Bld: 122 mg/dL — ABNORMAL HIGH (ref 65–99)
POTASSIUM: 3.2 mmol/L — AB (ref 3.5–5.1)
SODIUM: 138 mmol/L (ref 135–145)

## 2016-06-15 LAB — CBC
HEMATOCRIT: 38 % (ref 35.0–47.0)
HEMOGLOBIN: 13.2 g/dL (ref 12.0–16.0)
MCH: 29.4 pg (ref 26.0–34.0)
MCHC: 34.6 g/dL (ref 32.0–36.0)
MCV: 84.8 fL (ref 80.0–100.0)
Platelets: 307 10*3/uL (ref 150–440)
RBC: 4.48 MIL/uL (ref 3.80–5.20)
RDW: 13.4 % (ref 11.5–14.5)
WBC: 8.7 10*3/uL (ref 3.6–11.0)

## 2016-06-15 LAB — POCT PREGNANCY, URINE: PREG TEST UR: NEGATIVE

## 2016-06-15 LAB — URINALYSIS, MICROSCOPIC (REFLEX)
BACTERIA UA: NONE SEEN
SQUAMOUS EPITHELIAL / LPF: NONE SEEN

## 2016-06-15 MED ORDER — ONDANSETRON 4 MG PO TBDP: 4.0000 mg | ORAL_TABLET | Freq: Three times a day (TID) | ORAL | 0 refills | Status: DC | PRN

## 2016-06-15 MED ORDER — ONDANSETRON HCL 4 MG/2ML IJ SOLN
INTRAMUSCULAR | Status: AC
  Administered 2016-06-15: 4 mg via INTRAVENOUS
  Filled 2016-06-15: qty 2

## 2016-06-15 MED ORDER — MORPHINE SULFATE (PF) 4 MG/ML IV SOLN
INTRAVENOUS | Status: AC
  Administered 2016-06-15: 4 mg via INTRAVENOUS
  Filled 2016-06-15: qty 1

## 2016-06-15 MED ORDER — OXYCODONE-ACETAMINOPHEN 5-325 MG PO TABS
2.0000 | ORAL_TABLET | Freq: Once | ORAL | Status: AC
  Administered 2016-06-15: 2 via ORAL
  Filled 2016-06-15: qty 2

## 2016-06-15 MED ORDER — TAMSULOSIN HCL 0.4 MG PO CAPS: 0.4000 mg | ORAL_CAPSULE | Freq: Every day | ORAL | 0 refills | Status: DC

## 2016-06-15 MED ORDER — MORPHINE SULFATE (PF) 2 MG/ML IV SOLN: 2.0000 mg | Freq: Once | INTRAVENOUS | Status: DC

## 2016-06-15 MED ORDER — ONDANSETRON HCL 4 MG/2ML IJ SOLN
4.0000 mg | Freq: Once | INTRAMUSCULAR | Status: AC
  Administered 2016-06-15: 4 mg via INTRAVENOUS

## 2016-06-15 MED ORDER — MORPHINE SULFATE (PF) 4 MG/ML IV SOLN
4.0000 mg | Freq: Once | INTRAVENOUS | Status: AC
  Administered 2016-06-15: 4 mg via INTRAVENOUS

## 2016-06-15 MED ORDER — OXYCODONE-ACETAMINOPHEN 5-325 MG PO TABS: 1.0000 | ORAL_TABLET | ORAL | 0 refills | Status: DC | PRN

## 2016-06-15 NOTE — ED Provider Notes (Signed)
[facility] Emergency Department Provider Note   First MD Initiated Contact with Patient 06/15/16 0030     (approximate)  I have reviewed the triage vital signs and the nursing notes.   HISTORY  Chief Complaint Flank Pain   HPI Heather Burns is a 26 y.o. female below list of chronic medical conditions presents to the emergency department with acute onset of right flank pain with onset approximately 2 hours before presentation. Patient also admits to hematuria and one episode of vomiting. Patient denies any fever no dysuria. Patient denies any previous history of kidney stones or any familial history of kidney stones. Patient denies any diarrhea or constipation. Patient states current pain score is 10 out of 10. Patient denies any alleviating or aggravating factors.  Past Medical History:  Diagnosis Date  . Anxiety   . Asthma     Patient Active Problem List   Diagnosis Date Noted  . Viral upper respiratory infection 01/25/2016  . Anxiety and depression 05/07/2014    Past Surgical History:  Procedure Laterality Date  . WISDOM TOOTH EXTRACTION      Prior to Admission medications   Medication Sig Start Date End Date Taking? Authorizing Provider  albuterol (PROVENTIL HFA;VENTOLIN HFA) 108 (90 Base) MCG/ACT inhaler Inhale 2 puffs into the lungs every 4 (four) hours as needed for wheezing. 06/08/16   Dianne Dun, MD  venlafaxine XR (EFFEXOR-XR) 37.5 MG 24 hr capsule 2 pills in am and 1 pill qpm 12/18/15   Brandy Hale, MD    Allergies Patient has no known allergies.  Family History  Problem Relation Age of Onset  . Diabetes Maternal Grandmother   . Diabetes Paternal Grandmother     Social History Social History  Substance Use Topics  . Smoking status: Never Smoker  . Smokeless tobacco: Never Used  . Alcohol use No    Review of Systems Constitutional: No fever/chills Eyes: No visual changes. ENT: No sore throat. Cardiovascular: Denies chest  pain. Respiratory: Denies shortness of breath. Gastrointestinal: Positive for right flank pain and vomiting  Genitourinary: Negative for dysuria. Positive for hematuria Musculoskeletal: Negative for back pain. Skin: Negative for rash. Neurological: Negative for headaches, focal weakness or numbness.  10-point ROS otherwise negative.  ____________________________________________   PHYSICAL EXAM:  VITAL SIGNS: ED Triage Vitals  Enc Vitals Group     BP 06/15/16 0019 (!) 144/81     Pulse Rate 06/15/16 0019 96     Resp 06/15/16 0019 18     Temp 06/15/16 0019 97.3 F (36.3 C)     Temp Source 06/15/16 0019 Oral     SpO2 06/15/16 0019 100 %     Weight 06/15/16 0020 172 lb (78 kg)     Height 06/15/16 0020  (1.651 m)     Head Circumference --      Peak Flow --      Pain Score 06/15/16 0021 10     Pain Loc --      Pain Edu? --      Excl. in GC? --     Constitutional: Alert and oriented. Apparent discomfort. Eyes: Conjunctivae are normal. PERRL. EOMI. Head: Atraumatic. Mouth/Throat: Mucous membranes are moist.  Oropharynx non-erythematous. Neck: No stridor.   Cardiovascular: Normal rate, regular rhythm. Good peripheral circulation. Grossly normal heart sounds. Respiratory: Normal respiratory effort.  No retractions. Lungs CTAB. Gastrointestinal: Soft and nontender. No distention.  Musculoskeletal: No lower extremity tenderness nor edema. No gross deformities of extremities. Neurologic:  Normal speech and language. No gross focal neurologic deficits are appreciated.  Skin:  Skin is warm, dry and intact. No rash noted. Psychiatric: Mood and affect are normal. Speech and behavior are normal.  ____________________________________________   LABS (all labs ordered are listed, but only abnormal results are displayed)  Labs Reviewed  BASIC METABOLIC PANEL - Abnormal; Notable for the following:       Result Value   Potassium 3.2 (*)    Glucose, Bld 122 (*)    All other  components within normal limits  URINALYSIS, ROUTINE W REFLEX MICROSCOPIC - Abnormal; Notable for the following:    Color, Urine RED (*)    APPearance CLOUDY (*)    Glucose, UA   (*)    Value: TEST NOT REPORTED DUE TO COLOR INTERFERENCE OF URINE PIGMENT   Hgb urine dipstick   (*)    Value: TEST NOT REPORTED DUE TO COLOR INTERFERENCE OF URINE PIGMENT   Bilirubin Urine   (*)    Value: TEST NOT REPORTED DUE TO COLOR INTERFERENCE OF URINE PIGMENT   Ketones, ur   (*)    Value: TEST NOT REPORTED DUE TO COLOR INTERFERENCE OF URINE PIGMENT   Protein, ur   (*)    Value: TEST NOT REPORTED DUE TO COLOR INTERFERENCE OF URINE PIGMENT   Nitrite   (*)    Value: TEST NOT REPORTED DUE TO COLOR INTERFERENCE OF URINE PIGMENT   Leukocytes, UA   (*)    Value: TEST NOT REPORTED DUE TO COLOR INTERFERENCE OF URINE PIGMENT   All other components within normal limits  CBC  URINALYSIS, MICROSCOPIC (REFLEX)  POC URINE PREG, ED  POCT PREGNANCY, URINE     RADIOLOGY I, Burna N BROWN, personally viewed and evaluated these images (plain radiographs) as part of my medical decision making, as well as reviewing the written report by the radiologist.  Ct Renal Stone Study  Result Date: 06/15/2016 CLINICAL DATA:  Right flank pain starting this evening EXAM: CT ABDOMEN AND PELVIS WITHOUT CONTRAST TECHNIQUE: Multidetector CT imaging of the abdomen and pelvis was performed following the standard protocol without IV contrast. COMPARISON:  06/20/2007 FINDINGS: Lower chest: No acute abnormality. Hepatobiliary: No focal liver abnormality is seen. No gallstones, gallbladder wall thickening, or biliary dilatation. Pancreas: Unremarkable. No pancreatic ductal dilatation or surrounding inflammatory changes. Spleen: Normal in size without focal abnormality. Adrenals/Urinary Tract: Normal bilateral adrenal glands and left kidney. Enlargement of the right kidney with moderate right-sided hydroureteronephrosis secondary to calculi  within the ureter. The first calculus is seen 3 cm distal to the UPJ measuring 4 x 4.3 x 9 mm and is spindle shaped, series 2, image 43 and series 6, image 57. Approximately 7 cm distal to this calculus is a second calculus at the pelvic brim measuring 8 x 5 x 6 mm. No additional stones are seen distal to this. No bladder calculi are identified. There is a small nodular density along the anterosuperior wall measuring 1.7 x 0.8 cm and containing a peripheral calcification along its posterior aspect with an adjacent punctate calcification external to the bladder. This may represent a urachal remnant series 2, image 77 and series 7, image 84 or potentially sequela of prior urachal repair. Correlation with patient clinical history is suggested. Direct visual correlation may prove useful if there is no prior history of bladder intervention that might account for this appearance and to exclude a mucosal/mural lesion of the bladder. The calcifications appear to been present in 2009 however the  mural nodularity is more prominent on current exam, some which may be due to underdistention of the bladder on today's study but an enlarging lesion cannot be entirely excluded. Stomach/Bowel: Stomach is within normal limits. Appendix appears normal. No evidence of bowel wall thickening, distention, or inflammatory changes. Vascular/Lymphatic: Aortic atherosclerosis. No enlarged abdominal or pelvic lymph nodes. Reproductive: Uterus and bilateral adnexa are unremarkable. Other: No abdominal wall hernia or abnormality. No abdominopelvic ascites. Musculoskeletal: No acute or significant osseous findings. IMPRESSION: 1. Moderate right-sided hydroureteronephrosis secondary to tandem calculi within the right ureter the more proximal of which is 3 cm distal to the UPJ measuring 4 x 4.3 x 9 mm and a second which is 7 cm distal to this calculus at the pelvic brim measuring 8 x 5 x 6 mm. 2. Small nodular density along the anterosuperior wall  of the bladder measuring 1.7 x 0.8 cm with calcifications may represent a urachal remnant or repair. Correlation with patient's clinical history is recommended. The calcifications appear to have been present in 2009 however there is a more nodular appearance on current exam, possibly secondary to under distention of the bladder. If there is no history of prior surgery, direct visual correlation to exclude a mucosal/mural lesion of the bladder is recommended as adenocarcinomas have been known to arise from such remnants. Electronically Signed   By: Tollie Eth M.D.   On: 06/15/2016 02:48     Procedures   ____________________________________________   INITIAL IMPRESSION / ASSESSMENT AND PLAN / ED COURSE  Pertinent labs & imaging results that were available during my care of the patient were reviewed by me and considered in my medical decision making (see chart for details).  26 year old female presenting with acute onset of right flank pain accompanied by vomiting and hematuria. Concern for possible kidney stone and a such patient given morphine 4 mg as well as Zofran 4 mg for analgesia and antiemetic respectively. CT scan of the abdomen revealed tandem stones. I informed the patient and her father all clinical findings including those of the CT scan. I spoke with Dr. Berneice Heinrich urologist on call who will see the patient today in clinic. Patient was given Percocet 2 tablets before discharge pain controlled at this time.      ____________________________________________  FINAL CLINICAL IMPRESSION(S) / ED DIAGNOSES  Final diagnoses:  Right kidney stone     MEDICATIONS GIVEN DURING THIS VISIT:  Medications  oxyCODONE-acetaminophen (PERCOCET/ROXICET) 5-325 MG per tablet 2 tablet (not administered)  morphine 4 MG/ML injection 4 mg (4 mg Intravenous Given 06/15/16 0101)  ondansetron (ZOFRAN) injection 4 mg (4 mg Intravenous Given 06/15/16 0101)     NEW OUTPATIENT MEDICATIONS STARTED DURING  THIS VISIT:  New Prescriptions   No medications on file    Modified Medications   No medications on file    Discontinued Medications   CIPROFLOXACIN (CIPRO) 500 MG TABLET    Take 1 tablet (500 mg total) by mouth 2 (two) times daily.     Note:  This document was prepared using Dragon voice recognition software and may include unintentional dictation errors.    Darci Current, MD 06/15/16 [phone]

## 2016-06-15 NOTE — ED Triage Notes (Signed)
Pt in with co right flank pain that started tonight, no hx of the same. Denies any dysuria at this time.

## 2016-06-15 NOTE — H&P (Signed)
H&P  Chief Complaint: Right ureteral stones  History of Present Illness: Heather Burns is a 26 y.o. year old female who presents for ESL of 2 ureteral stones on the right. She presented with rt flank pain to the ER recently. CT revealed 2 ureteral stones. She was instructed in  Treatment options and has chosen to have ESL.  She has experienced N/V as well as flank pain. She denies F/C, UTIs.  Past Medical History:  Diagnosis Date  . Anxiety   . Asthma     Past Surgical History:  Procedure Laterality Date  . WISDOM TOOTH EXTRACTION      Home Medications:  No prescriptions prior to admission.    Allergies: No Known Allergies  Family History  Problem Relation Age of Onset  . Diabetes Maternal Grandmother   . Diabetes Paternal Grandmother     Social History:  reports that she has never smoked. She has never used smokeless tobacco. She reports that she does not drink alcohol or use drugs.  ROS: A complete review of systems was performed.  All systems are negative except for pertinent findings as noted.  Physical Exam:  Vital signs in last 24 hours: Temp:  [97.3 F (36.3 C)] 97.3 F (36.3 C) (04/11 0019) Pulse Rate:  [86-104] 86 (04/11 0400) Resp:  [18] 18 (04/11 0019) BP: (126-144)/(73-81) 126/77 (04/11 0400) SpO2:  [100 %] 100 % (04/11 0400) Weight:  [78 kg (172 lb)] 78 kg (172 lb) (04/11 0020) General:  Alert and oriented, No acute distress HEENT: Normocephalic, atraumatic Neck: No JVD or lymphadenopathy Cardiovascular: Regular rate and rhythm Lungs: Clear bilaterally Abdomen: Soft, nontender, nondistended, no abdominal masses Back: No CVA tenderness Extremities: No edema Neurologic: Grossly intact  Laboratory Data:  Results for orders placed or performed during the hospital encounter of 06/15/16 (from the past 24 hour(s))  Basic metabolic panel     Status: Abnormal   Collection Time: 06/15/16 12:20 AM  Result Value Ref Range   Sodium 138 135 - 145 mmol/L    Potassium 3.2 (L) 3.5 - 5.1 mmol/L   Chloride 106 101 - 111 mmol/L   CO2 25 22 - 32 mmol/L   Glucose, Bld 122 (H) 65 - 99 mg/dL   BUN 13 6 - 20 mg/dL   Creatinine, Ser 1.61 0.44 - 1.00 mg/dL   Calcium 9.5 8.9 - 09.6 mg/dL   GFR calc non Af Amer >60 >60 mL/min   GFR calc Af Amer >60 >60 mL/min   Anion gap 7 5 - 15  CBC     Status: None   Collection Time: 06/15/16 12:20 AM  Result Value Ref Range   WBC 8.7 3.6 - 11.0 K/uL   RBC 4.48 3.80 - 5.20 MIL/uL   Hemoglobin 13.2 12.0 - 16.0 g/dL   HCT 04.5 40.9 - 81.1 %   MCV 84.8 80.0 - 100.0 fL   MCH 29.4 26.0 - 34.0 pg   MCHC 34.6 32.0 - 36.0 g/dL   RDW 91.4 78.2 - 95.6 %   Platelets 307 150 - 440 K/uL  Urinalysis, Routine w reflex microscopic     Status: Abnormal   Collection Time: 06/15/16 12:20 AM  Result Value Ref Range   Color, Urine RED (A) YELLOW   APPearance CLOUDY (A) CLEAR   Specific Gravity, Urine 1.025 1.005 - 1.030   pH  5.0 - 8.0    TEST NOT REPORTED DUE TO COLOR INTERFERENCE OF URINE PIGMENT   Glucose, UA (A) NEGATIVE mg/dL  TEST NOT REPORTED DUE TO COLOR INTERFERENCE OF URINE PIGMENT   Hgb urine dipstick (A) NEGATIVE    TEST NOT REPORTED DUE TO COLOR INTERFERENCE OF URINE PIGMENT   Bilirubin Urine (A) NEGATIVE    TEST NOT REPORTED DUE TO COLOR INTERFERENCE OF URINE PIGMENT   Ketones, ur (A) NEGATIVE mg/dL    TEST NOT REPORTED DUE TO COLOR INTERFERENCE OF URINE PIGMENT   Protein, ur (A) NEGATIVE mg/dL    TEST NOT REPORTED DUE TO COLOR INTERFERENCE OF URINE PIGMENT   Nitrite (A) NEGATIVE    TEST NOT REPORTED DUE TO COLOR INTERFERENCE OF URINE PIGMENT   Leukocytes, UA (A) NEGATIVE    TEST NOT REPORTED DUE TO COLOR INTERFERENCE OF URINE PIGMENT  Urinalysis, Microscopic (reflex)     Status: None   Collection Time: 06/15/16 12:20 AM  Result Value Ref Range   RBC / HPF TOO NUMEROUS TO COUNT 0 - 5 RBC/hpf   WBC, UA TOO NUMEROUS TO COUNT 0 - 5 WBC/hpf   Bacteria, UA NONE SEEN NONE SEEN   Squamous Epithelial / LPF  NONE SEEN NONE SEEN  Pregnancy, urine POC     Status: None   Collection Time: 06/15/16 12:28 AM  Result Value Ref Range   Preg Test, Ur NEGATIVE NEGATIVE   No results found for this or any previous visit (from the past 240 hour(s)). Creatinine:  Recent Labs  06/15/16 0020  CREATININE 0.90    Radiologic Imaging: Ct Renal Stone Study  Result Date: 06/15/2016 CLINICAL DATA:  Right flank pain starting this evening EXAM: CT ABDOMEN AND PELVIS WITHOUT CONTRAST TECHNIQUE: Multidetector CT imaging of the abdomen and pelvis was performed following the standard protocol without IV contrast. COMPARISON:  06/20/2007 FINDINGS: Lower chest: No acute abnormality. Hepatobiliary: No focal liver abnormality is seen. No gallstones, gallbladder wall thickening, or biliary dilatation. Pancreas: Unremarkable. No pancreatic ductal dilatation or surrounding inflammatory changes. Spleen: Normal in size without focal abnormality. Adrenals/Urinary Tract: Normal bilateral adrenal glands and left kidney. Enlargement of the right kidney with moderate right-sided hydroureteronephrosis secondary to calculi within the ureter. The first calculus is seen 3 cm distal to the UPJ measuring 4 x 4.3 x 9 mm and is spindle shaped, series 2, image 43 and series 6, image 57. Approximately 7 cm distal to this calculus is a second calculus at the pelvic brim measuring 8 x 5 x 6 mm. No additional stones are seen distal to this. No bladder calculi are identified. There is a small nodular density along the anterosuperior wall measuring 1.7 x 0.8 cm and containing a peripheral calcification along its posterior aspect with an adjacent punctate calcification external to the bladder. This may represent a urachal remnant series 2, image 77 and series 7, image 84 or potentially sequela of prior urachal repair. Correlation with patient clinical history is suggested. Direct visual correlation may prove useful if there is no prior history of bladder  intervention that might account for this appearance and to exclude a mucosal/mural lesion of the bladder. The calcifications appear to been present in 2009 however the mural nodularity is more prominent on current exam, some which may be due to underdistention of the bladder on today's study but an enlarging lesion cannot be entirely excluded. Stomach/Bowel: Stomach is within normal limits. Appendix appears normal. No evidence of bowel wall thickening, distention, or inflammatory changes. Vascular/Lymphatic: Aortic atherosclerosis. No enlarged abdominal or pelvic lymph nodes. Reproductive: Uterus and bilateral adnexa are unremarkable. Other: No abdominal wall hernia or abnormality.  No abdominopelvic ascites. Musculoskeletal: No acute or significant osseous findings. IMPRESSION: 1. Moderate right-sided hydroureteronephrosis secondary to tandem calculi within the right ureter the more proximal of which is 3 cm distal to the UPJ measuring 4 x 4.3 x 9 mm and a second which is 7 cm distal to this calculus at the pelvic brim measuring 8 x 5 x 6 mm. 2. Small nodular density along the anterosuperior wall of the bladder measuring 1.7 x 0.8 cm with calcifications may represent a urachal remnant or repair. Correlation with patient's clinical history is recommended. The calcifications appear to have been present in 2009 however there is a more nodular appearance on current exam, possibly secondary to under distention of the bladder. If there is no history of prior surgery, direct visual correlation to exclude a mucosal/mural lesion of the bladder is recommended as adenocarcinomas have been known to arise from such remnants. Electronically Signed   By: Tollie Eth M.D.   On: 06/15/2016 02:48    Impression/Assessment:  2 right ureteral stones  Plan:  ESL  Chelsea Aus 06/15/2016, 6:53 PM  Bertram Millard. Javeon Macmurray MD

## 2016-06-16 ENCOUNTER — Ambulatory Visit (HOSPITAL_COMMUNITY): Payer: BLUE CROSS/BLUE SHIELD

## 2016-06-16 ENCOUNTER — Ambulatory Visit (HOSPITAL_COMMUNITY)
Admission: RE | Admit: 2016-06-16 | Discharge: 2016-06-16 | Disposition: A | Discharge status: Home / Self Care | Payer: BLUE CROSS/BLUE SHIELD | Source: Ambulatory Visit | Attending: Urology | Admitting: Urology

## 2016-06-16 ENCOUNTER — Encounter (HOSPITAL_COMMUNITY)
Admission: RE | Disposition: A | Discharge status: Home / Self Care | Payer: Self-pay | Source: Ambulatory Visit | Attending: Urology

## 2016-06-16 ENCOUNTER — Encounter (HOSPITAL_COMMUNITY): Payer: Self-pay

## 2016-06-16 DIAGNOSIS — Z9889 Other specified postprocedural states: Secondary | ICD-10-CM | POA: Diagnosis not present

## 2016-06-16 DIAGNOSIS — I700 Atherosclerosis of aorta: Secondary | ICD-10-CM | POA: Diagnosis not present

## 2016-06-16 DIAGNOSIS — F419 Anxiety disorder, unspecified: Secondary | ICD-10-CM | POA: Diagnosis not present

## 2016-06-16 DIAGNOSIS — N201 Calculus of ureter: Secondary | ICD-10-CM

## 2016-06-16 DIAGNOSIS — I10 Essential (primary) hypertension: Secondary | ICD-10-CM | POA: Diagnosis not present

## 2016-06-16 DIAGNOSIS — J45909 Unspecified asthma, uncomplicated: Secondary | ICD-10-CM | POA: Diagnosis not present

## 2016-06-16 DIAGNOSIS — Z7951 Long term (current) use of inhaled steroids: Secondary | ICD-10-CM | POA: Diagnosis not present

## 2016-06-16 DIAGNOSIS — Z833 Family history of diabetes mellitus: Secondary | ICD-10-CM | POA: Insufficient documentation

## 2016-06-16 DIAGNOSIS — Z79891 Long term (current) use of opiate analgesic: Secondary | ICD-10-CM | POA: Insufficient documentation

## 2016-06-16 HISTORY — DX: Calculus of kidney: N20.0

## 2016-06-16 HISTORY — PX: EXTRACORPOREAL SHOCK WAVE LITHOTRIPSY: SHX1557

## 2016-06-16 SURGERY — LITHOTRIPSY, ESWL
Anesthesia: LOCAL | Laterality: Right

## 2016-06-16 MED ORDER — DIPHENHYDRAMINE HCL 25 MG PO CAPS
25.0000 mg | ORAL_CAPSULE | ORAL | Status: AC
  Administered 2016-06-16: 25 mg via ORAL
  Filled 2016-06-16: qty 1

## 2016-06-16 MED ORDER — SODIUM CHLORIDE 0.9% FLUSH: 3.0000 mL | Freq: Two times a day (BID) | INTRAVENOUS | Status: DC

## 2016-06-16 MED ORDER — SODIUM CHLORIDE 0.9% FLUSH: 3.0000 mL | INTRAVENOUS | Status: DC | PRN

## 2016-06-16 MED ORDER — SODIUM CHLORIDE 0.9 % IV SOLN: 250.0000 mL | INTRAVENOUS | Status: DC | PRN

## 2016-06-16 MED ORDER — KETOROLAC TROMETHAMINE 30 MG/ML IJ SOLN: 30.0000 mg | Freq: Four times a day (QID) | INTRAMUSCULAR | Status: DC

## 2016-06-16 MED ORDER — OXYCODONE HCL 5 MG PO TABS: 5.0000 mg | ORAL_TABLET | ORAL | Status: DC | PRN

## 2016-06-16 MED ORDER — ACETAMINOPHEN 650 MG RE SUPP
650.0000 mg | RECTAL | Status: DC | PRN
  Filled 2016-06-16: qty 1

## 2016-06-16 MED ORDER — LEVOFLOXACIN 500 MG PO TABS
500.0000 mg | ORAL_TABLET | ORAL | Status: AC
  Administered 2016-06-16: 500 mg via ORAL
  Filled 2016-06-16: qty 1

## 2016-06-16 MED ORDER — SODIUM CHLORIDE 0.9 % IV SOLN
INTRAVENOUS | Status: DC
  Administered 2016-06-16: 12:00:00 via INTRAVENOUS

## 2016-06-16 MED ORDER — ACETAMINOPHEN 325 MG PO TABS: 650.0000 mg | ORAL_TABLET | ORAL | Status: DC | PRN

## 2016-06-16 MED ORDER — DIAZEPAM 5 MG PO TABS
10.0000 mg | ORAL_TABLET | ORAL | Status: AC
  Administered 2016-06-16: 10 mg via ORAL
  Filled 2016-06-16: qty 2

## 2016-06-16 SURGICAL SUPPLY — 2 items
COVER SURGICAL LIGHT HANDLE (MISCELLANEOUS) ×2 IMPLANT
TOWEL OR 17X26 10 PK STRL BLUE (TOWEL DISPOSABLE) ×2 IMPLANT

## 2016-06-16 NOTE — Discharge Instructions (Signed)
See [facility] discharge instructions in chart.  

## 2016-06-16 NOTE — Op Note (Signed)
See [facility] note scanned into chart. 

## 2016-12-13 ENCOUNTER — Ambulatory Visit: Payer: BLUE CROSS/BLUE SHIELD | Admitting: Internal Medicine

## 2017-01-16 ENCOUNTER — Ambulatory Visit (INDEPENDENT_AMBULATORY_CARE_PROVIDER_SITE_OTHER): Payer: BLUE CROSS/BLUE SHIELD | Admitting: Internal Medicine

## 2017-01-16 ENCOUNTER — Encounter: Payer: Self-pay | Admitting: Internal Medicine

## 2017-01-16 DIAGNOSIS — J4520 Mild intermittent asthma, uncomplicated: Secondary | ICD-10-CM

## 2017-01-16 DIAGNOSIS — F329 Major depressive disorder, single episode, unspecified: Secondary | ICD-10-CM

## 2017-01-16 DIAGNOSIS — F419 Anxiety disorder, unspecified: Secondary | ICD-10-CM

## 2017-01-16 DIAGNOSIS — F32A Depression, unspecified: Secondary | ICD-10-CM

## 2017-01-16 DIAGNOSIS — J45909 Unspecified asthma, uncomplicated: Secondary | ICD-10-CM | POA: Insufficient documentation

## 2017-01-16 DIAGNOSIS — Z87442 Personal history of urinary calculi: Secondary | ICD-10-CM | POA: Diagnosis not present

## 2017-01-16 NOTE — Patient Instructions (Signed)
Stress and Stress Management Stress is a normal reaction to life events. It is what you feel when life demands more than you are used to or more than you can handle. Some stress can be useful. For example, the stress reaction can help you catch the last bus of the day, study for a test, or meet a deadline at work. But stress that occurs too often or for too long can cause problems. It can affect your emotional health and interfere with relationships and normal daily activities. Too much stress can weaken your immune system and increase your risk for physical illness. If you already have a medical problem, stress can make it worse. What are the causes? All sorts of life events may cause stress. An event that causes stress for one person may not be stressful for another person. Major life events commonly cause stress. These may be positive or negative. Examples include losing your job, moving into a new home, getting married, having a baby, or losing a loved one. Less obvious life events may also cause stress, especially if they occur day after day or in combination. Examples include working long hours, driving in traffic, caring for children, being in debt, or being in a difficult relationship. What are the signs or symptoms? Stress may cause emotional symptoms including, the following:  Anxiety. This is feeling worried, afraid, on edge, overwhelmed, or out of control.  Anger. This is feeling irritated or impatient.  Depression. This is feeling sad, down, helpless, or guilty.  Difficulty focusing, remembering, or making decisions.  Stress may cause physical symptoms, including the following:  Aches and pains. These may affect your head, neck, back, stomach, or other areas of your body.  Tight muscles or clenched jaw.  Low energy or trouble sleeping.  Stress may cause unhealthy behaviors, including the following:  Eating to feel better (overeating) or skipping meals.  Sleeping too little,  too much, or both.  Working too much or putting off tasks (procrastination).  Smoking, drinking alcohol, or using drugs to feel better.  How is this diagnosed? Stress is diagnosed through an assessment by your health care provider. Your health care provider will ask questions about your symptoms and any stressful life events.Your health care provider will also ask about your medical history and may order blood tests or other tests. Certain medical conditions and medicine can cause physical symptoms similar to stress. Mental illness can cause emotional symptoms and unhealthy behaviors similar to stress. Your health care provider may refer you to a mental health professional for further evaluation. How is this treated? Stress management is the recommended treatment for stress.The goals of stress management are reducing stressful life events and coping with stress in healthy ways. Techniques for reducing stressful life events include the following:  Stress identification. Self-monitor for stress and identify what causes stress for you. These skills may help you to avoid some stressful events.  Time management. Set your priorities, keep a calendar of events, and learn to say "no." These tools can help you avoid making too many commitments.  Techniques for coping with stress include the following:  Rethinking the problem. Try to think realistically about stressful events rather than ignoring them or overreacting. Try to find the positives in a stressful situation rather than focusing on the negatives.  Exercise. Physical exercise can release both physical and emotional tension. The key is to find a form of exercise you enjoy and do it regularly.  Relaxation techniques. These relax the body and  mind. Examples include yoga, meditation, tai chi, biofeedback, deep breathing, progressive muscle relaxation, listening to music, being out in nature, journaling, and other hobbies. Again, the key is to find  one or more that you enjoy and can do regularly.  Healthy lifestyle. Eat a balanced diet, get plenty of sleep, and do not smoke. Avoid using alcohol or drugs to relax.  Strong support network. Spend time with family, friends, or other people you enjoy being around.Express your feelings and talk things over with someone you trust.  Counseling or talktherapy with a mental health professional may be helpful if you are having difficulty managing stress on your own. Medicine is typically not recommended for the treatment of stress.Talk to your health care provider if you think you need medicine for symptoms of stress. Follow these instructions at home:  Keep all follow-up visits as directed by your health care provider.  Take all medicines as directed by your health care provider. Contact a health care provider if:  Your symptoms get worse or you start having new symptoms.  You feel overwhelmed by your problems and can no longer manage them on your own. Get help right away if:  You feel like hurting yourself or someone else. This information is not intended to replace advice given to you by your health care provider. Make sure you discuss any questions you have with your health care provider. Document Released: 08/17/2000 Document Revised: 07/30/2015 Document Reviewed: 10/16/2012 Elsevier Interactive Patient Education  2017 [employer]  

## 2017-01-16 NOTE — Assessment & Plan Note (Signed)
Continue Albuterol prn 

## 2017-01-16 NOTE — Assessment & Plan Note (Signed)
Last occurred in April 2018 Will monitor

## 2017-01-16 NOTE — Assessment & Plan Note (Signed)
Currently not an issue Will monitor 

## 2017-01-16 NOTE — Progress Notes (Signed)
HPI  Pt presents to the clinic today to establish care and for management of the conditions listed below. She is transferring care from Dr. Dayton MartesAron.  Anxiety and Depression: Triggered by general life stress. She has been on Effexor in the past but is currently not taking anything OTC. She has been to counseling in the past but has not felt that she needed to see anyone lately. She denies depression, SI/HI.  Asthma: Mild, intermittent. She uses Albuterol rarely as needed.  History of Kidney Stones: Last occurred in April. S/p Lithotripsy. She has not had any issues since.  Flu: never Tetanus: unsure Pap Smear: 2014, [facility] Dentist: biannually  Past Medical History:  Diagnosis Date  . Anxiety   . Asthma   . Kidney stones     Current Outpatient Medications  Medication Sig Dispense Refill  . albuterol (PROVENTIL HFA;VENTOLIN HFA) 108 (90 Base) MCG/ACT inhaler Inhale 2 puffs into the lungs every 4 (four) hours as needed for wheezing. 1 Inhaler 0  . tamsulosin (FLOMAX) 0.4 MG CAPS capsule Take 1 capsule (0.4 mg total) by mouth daily after breakfast. 7 capsule 0  . venlafaxine XR (EFFEXOR-XR) 37.5 MG 24 hr capsule 2 pills in am and 1 pill qpm 90 capsule 1   No current facility-administered medications for this visit.     No Known Allergies  Family History  Problem Relation Age of Onset  . Diabetes Maternal Grandmother   . Diabetes Paternal Grandmother     Social History   Socioeconomic History  . Marital status: Single    Spouse name: Not on file  . Number of children: Not on file  . Years of education: Not on file  . Highest education level: Not on file  Social Needs  . Financial resource strain: Not on file  . Food insecurity - worry: Not on file  . Food insecurity - inability: Not on file  . Transportation needs - medical: Not on file  . Transportation needs - non-medical: Not on file  Occupational History  . Not on file  Tobacco Use  . Smoking status: Never  Smoker  . Smokeless tobacco: Never Used  Substance and Sexual Activity  . Alcohol use: No    Alcohol/week: 0.0 oz  . Drug use: No  . Sexual activity: Not Currently  Other Topics Concern  . Not on file  Social History Narrative  . Not on file    ROS:  Constitutional: Denies fever, malaise, fatigue, headache or abrupt weight changes.  HEENT: Denies eye pain, eye redness, ear pain, ringing in the ears, wax buildup, runny nose, nasal congestion, bloody nose, or sore throat. Respiratory: Denies difficulty breathing, shortness of breath, cough or sputum production.   Cardiovascular: Denies chest pain, chest tightness, palpitations or swelling in the hands or feet.  Gastrointestinal: Denies abdominal pain, bloating, constipation, diarrhea or blood in the stool.  GU: Denies frequency, urgency, pain with urination, blood in urine, odor or discharge. Musculoskeletal: Denies decrease in range of motion, difficulty with gait, muscle pain or joint pain and swelling.  Skin: Denies redness, rashes, lesions or ulcercations.  Neurological: Denies dizziness, difficulty with memory, difficulty with speech or problems with balance and coordination.  Psych: Pt has a history of anxiety. Denies depression, SI/HI.  No other specific complaints in a complete review of systems (except as listed in HPI above).  PE:  BP 122/80   Pulse 73   Temp 97.9 F (36.6 C) (Oral)   Ht 5\' 5"  (1.651  m)   Wt 161 lb (73 kg)   LMP 01/12/2017   SpO2 98%   BMI 26.79 kg/m  Wt Readings from Last 3 Encounters:  01/16/17 161 lb (73 kg)  06/16/16 167 lb 9.6 oz (76 kg)  06/15/16 172 lb (78 kg)    General: Appears her stated age, well developed, well nourished in NAD. Cardiovascular: Normal rate and rhythm. S1,S2 noted.  No murmur, rubs or gallops noted.  Pulmonary/Chest: Normal effort and positive vesicular breath sounds. No respiratory distress. No wheezes, rales or ronchi noted.  Neurological: Alert and oriented.   Psychiatric: Mood and affect normal. Behavior is normal. Judgment and thought content normal.   BMET    Component Value Date/Time   NA 138 06/15/2016 0020   K 3.2 (L) 06/15/2016 0020   CL 106 06/15/2016 0020   CO2 25 06/15/2016 0020   GLUCOSE 122 (H) 06/15/2016 0020   BUN 13 06/15/2016 0020   CREATININE 0.90 06/15/2016 0020   CALCIUM 9.5 06/15/2016 0020   GFRNONAA >60 06/15/2016 0020   GFRAA >60 06/15/2016 0020    Lipid Panel  No results found for: CHOL, TRIG, HDL, CHOLHDL, VLDL, LDLCALC  CBC    Component Value Date/Time   WBC 8.7 06/15/2016 0020   RBC 4.48 06/15/2016 0020   HGB 13.2 06/15/2016 0020   HCT 38.0 06/15/2016 0020   PLT 307 06/15/2016 0020   MCV 84.8 06/15/2016 0020   MCH 29.4 06/15/2016 0020   MCHC 34.6 06/15/2016 0020   RDW 13.4 06/15/2016 0020    Hgb A1C No results found for: HGBA1C   Assessment and Plan:

## 2017-02-23 ENCOUNTER — Encounter: Payer: BLUE CROSS/BLUE SHIELD | Admitting: Internal Medicine

## 2017-04-03 ENCOUNTER — Ambulatory Visit (INDEPENDENT_AMBULATORY_CARE_PROVIDER_SITE_OTHER): Payer: 59 | Admitting: Internal Medicine

## 2017-04-03 ENCOUNTER — Encounter: Payer: Self-pay | Admitting: Internal Medicine

## 2017-04-03 VITALS — BP 110/78 | HR 102 | Temp 97.9°F | Wt 159.8 lb

## 2017-04-03 DIAGNOSIS — J0100 Acute maxillary sinusitis, unspecified: Secondary | ICD-10-CM | POA: Diagnosis not present

## 2017-04-03 MED ORDER — AMOXICILLIN-POT CLAVULANATE 875-125 MG PO TABS: 1.0000 | ORAL_TABLET | Freq: Two times a day (BID) | ORAL | 0 refills | Status: DC

## 2017-04-03 NOTE — Progress Notes (Signed)
HPI  Pt presents to the clinic today with c/o headache, facial pain and pressure, nasal congestion and sore throat. This started 1 week ago. She is blowing thick, green mucous out of her nose. She denies difficulty swallowing. She denise fever, chills or body aches. She has tried Sudafed, Tylenol Sinus with minimal relief. She has a history of allergies and asthma. She has had sick contacts.  Review of Systems     Past Medical History:  Diagnosis Date  . Anxiety   . Asthma   . Kidney stones     Family History  Problem Relation Age of Onset  . Diabetes Maternal Grandmother   . Diabetes Paternal Grandmother     Social History   Socioeconomic History  . Marital status: Single    Spouse name: Not on file  . Number of children: Not on file  . Years of education: Not on file  . Highest education level: Not on file  Social Needs  . Financial resource strain: Not on file  . Food insecurity - worry: Not on file  . Food insecurity - inability: Not on file  . Transportation needs - medical: Not on file  . Transportation needs - non-medical: Not on file  Occupational History  . Not on file  Tobacco Use  . Smoking status: Never Smoker  . Smokeless tobacco: Never Used  Substance and Sexual Activity  . Alcohol use: No    Alcohol/week: 0.0 oz  . Drug use: No  . Sexual activity: Not Currently  Other Topics Concern  . Not on file  Social History Narrative  . Not on file    No Known Allergies   Constitutional: Positive headache. Denies fatigue, fever or abrupt weight changes.  HEENT:  Positive facial pain, nasal congestion and sore throat. Denies eye redness, ear pain, ringing in the ears, wax buildup, runny nose or bloody nose. Respiratory: Denies cough, difficulty breathing or shortness of breath.  Cardiovascular: Denies chest pain, chest tightness, palpitations or swelling in the hands or feet.   No other specific complaints in a complete review of systems (except as listed  in HPI above).  Objective:   BP 110/78 (BP Location: Right Arm, Patient Position: Sitting, Cuff Size: Normal)   Pulse (!) 102   Temp 97.9 F (36.6 C) (Oral)   Wt 159 lb 12 oz (72.5 kg)   SpO2 98%   BMI 26.58 kg/m   General: Appears her stated age, in NAD. HEENT: Head: normal shape and size, maxillary sinus tenderness noted;  Ears: Tm's gray and intact, normal light reflex; Nose: mucosa boggy and moist, septum midline; Throat/Mouth: + PND. Teeth present, mucosa erythematous and moist, no exudate noted, no lesions or ulcerations noted.  Neck:  No adenopathy noted.  Cardiovascular: Tachycardic with normal rhythm. S1,S2 noted.  No murmur, rubs or gallops noted.  Pulmonary/Chest: Normal effort and positive vesicular breath sounds. No respiratory distress. No wheezes, rales or ronchi noted.       Assessment & Plan:   Acute Maxillary Sinusitis  Can use a Neti Pot which can be purchased from your local drug store. Flonase 2 sprays each nostril for 3 days and then as needed. eRx for Augmentin BID for 10 days  RTC as needed or if symptoms persist. Nicki ReaperBAITY, Shamaya Kauer, NP

## 2017-04-03 NOTE — Patient Instructions (Signed)

## 2017-05-15 ENCOUNTER — Encounter: Payer: 59 | Admitting: Internal Medicine

## 2017-05-18 ENCOUNTER — Ambulatory Visit: Payer: 59 | Admitting: Internal Medicine

## 2017-05-18 ENCOUNTER — Encounter: Payer: Self-pay | Admitting: Internal Medicine

## 2017-05-18 VITALS — BP 124/78 | HR 88 | Temp 97.9°F | Wt 159.0 lb

## 2017-05-18 DIAGNOSIS — H6983 Other specified disorders of Eustachian tube, bilateral: Secondary | ICD-10-CM | POA: Diagnosis not present

## 2017-05-18 DIAGNOSIS — B370 Candidal stomatitis: Secondary | ICD-10-CM

## 2017-05-18 DIAGNOSIS — J029 Acute pharyngitis, unspecified: Secondary | ICD-10-CM

## 2017-05-18 MED ORDER — NYSTATIN 100000 UNIT/ML MT SUSP: 5.0000 mL | Freq: Four times a day (QID) | OROMUCOSAL | 0 refills | Status: AC

## 2017-05-18 MED ORDER — METHYLPREDNISOLONE ACETATE 80 MG/ML IJ SUSP
80.0000 mg | Freq: Once | INTRAMUSCULAR | Status: AC
  Administered 2017-05-18: 80 mg via INTRAMUSCULAR

## 2017-05-18 NOTE — Patient Instructions (Signed)
Oral Thrush, Adult Oral thrush is an infection in your mouth and throat. It causes white patches on your tongue and in your mouth. Follow these instructions at home: Helping with soreness  To lessen your pain: ? Drink cold liquids, like water and iced tea. ? Eat frozen ice pops or frozen juices. ? Eat foods that are easy to swallow, like gelatin and ice cream. ? Drink from a straw if the patches in your mouth are painful. General instructions   Take or use over-the-counter and prescription medicines only as told by your doctor. Medicine for oral thrush may be something to swallow, or it may be something to put on the infected area.  Eat plain yogurt that has live cultures in it. Read the label to make sure.  If you wear dentures: ? Take out your dentures before you go to bed. ? Brush them well. ? Soak them in a denture cleaner.  Rinse your mouth with warm salt-water many times a day. To make the salt-water mixture, completely dissolve 1/2-1 teaspoon of salt in 1 cup of warm water. Contact a doctor if:  Your problems are getting worse.  Your problems do not get better in less than 7 days with treatment.  Your infection is spreading. This may show as white patches on the skin outside of your mouth.  You are nursing your baby and you have redness and pain in the nipples. This information is not intended to replace advice given to you by your health care provider. Make sure you discuss any questions you have with your health care provider. Document Released: 05/18/2009 Document Revised: 11/16/2015 Document Reviewed: 11/16/2015 Elsevier Interactive Patient Education  2017 [employer] Inc[employer]  

## 2017-05-19 ENCOUNTER — Other Ambulatory Visit: Payer: Self-pay | Admitting: Internal Medicine

## 2017-05-19 MED ORDER — CLOTRIMAZOLE 10 MG MT TROC: 10.0000 mg | Freq: Every day | OROMUCOSAL | 0 refills | Status: DC

## 2017-05-19 NOTE — Addendum Note (Signed)
Addended by: Lorre MunroeBAITY, Aayat Hajjar W on: 05/19/2017 02:27 PM   Modules accepted: Orders

## 2017-05-19 NOTE — Telephone Encounter (Signed)
Clotrimazole sent to pharmacy.

## 2017-05-19 NOTE — Telephone Encounter (Signed)
Copied from CRM [phone]. Topic: Quick Communication - Rx Refill/Question >> May 19, 2017 12:43 PM Jonette EvaBarksdale, Harvey B wrote: Pt called and states that the pharmacy does not have this Rx nor do any of the [facility] have any [facility] pt is needing a alternate medication, contact pt or pharmacy to advise  Medication: nystatin (MYCOSTATIN) 100000 UNIT/ML suspension [[id number]   Has the patient contacted their pharmacy? Yes.

## 2017-05-24 ENCOUNTER — Telehealth: Payer: Self-pay | Admitting: Internal Medicine

## 2017-05-24 ENCOUNTER — Encounter: Payer: Self-pay | Admitting: Internal Medicine

## 2017-05-24 MED ORDER — CLOTRIMAZOLE 10 MG MT TROC: 10.0000 mg | Freq: Every day | OROMUCOSAL | 0 refills | Status: AC

## 2017-05-24 NOTE — Progress Notes (Signed)
Subjective:    Patient ID: Heather ParsonsLauren Burns, female    DOB: 11/27/1990, 27 y.o.   MRN: [id number]  HPI  Pt presents to the clinic today with c/o ear pain and sore throat. This started 2 weeks ago. She describes the pain as sore and achy. She denies difficulty hearing. She is having some pain with swallowing, worse on the right side. She denies runny nose, nasal congestion or cough. She denies fever, chills or body aches. She has tried Sudafed and salt water gargles with some relief. She has not had sick contacts.  Review of Systems      Past Medical History:  Diagnosis Date  . Anxiety   . Asthma   . Kidney stones     Current Outpatient Medications  Medication Sig Dispense Refill  . albuterol (PROVENTIL HFA;VENTOLIN HFA) 108 (90 Base) MCG/ACT inhaler Inhale 2 puffs into the lungs every 4 (four) hours as needed for wheezing. 1 Inhaler 0  . clotrimazole (MYCELEX) 10 MG troche Take 1 tablet (10 mg total) by mouth 5 (five) times daily. 25 tablet 0  . nystatin (MYCOSTATIN) 100000 UNIT/ML suspension Take 5 mLs (500,000 Units total) by mouth 4 (four) times daily. 120 mL 0   No current facility-administered medications for this visit.     No Known Allergies  Family History  Problem Relation Age of Onset  . Diabetes Maternal Grandmother   . Diabetes Paternal Grandmother     Social History   Socioeconomic History  . Marital status: Single    Spouse name: Not on file  . Number of children: Not on file  . Years of education: Not on file  . Highest education level: Not on file  Social Needs  . Financial resource strain: Not on file  . Food insecurity - worry: Not on file  . Food insecurity - inability: Not on file  . Transportation needs - medical: Not on file  . Transportation needs - non-medical: Not on file  Occupational History  . Not on file  Tobacco Use  . Smoking status: Never Smoker  . Smokeless tobacco: Never Used  Substance and Sexual Activity  . Alcohol use: No   Alcohol/week: 0.0 oz  . Drug use: No  . Sexual activity: Not Currently  Other Topics Concern  . Not on file  Social History Narrative  . Not on file     Constitutional: Denies fever, malaise, fatigue, headache or abrupt weight changes.  HEENT: Pt reports ear pain and sore throat. Denies eye pain, eye redness, ringing in the ears, wax buildup, runny nose, nasal congestion, bloody nose. Respiratory: Denies difficulty breathing, shortness of breath, cough or sputum production.     No other specific complaints in a complete review of systems (except as listed in HPI above).  Objective:   Physical Exam  BP 124/78   Pulse 88   Temp 97.9 F (36.6 C) (Oral)   Wt 159 lb (72.1 kg)   LMP 04/28/2017 (LMP Unknown)   SpO2 99%   BMI 26.46 kg/m  Wt Readings from Last 3 Encounters:  05/18/17 159 lb (72.1 kg)  04/03/17 159 lb 12 oz (72.5 kg)  01/16/17 161 lb (73 kg)    General: Appears her stated age, in NAD. HEENT: Ears: Tm's gray and intact, normal light reflex, serous effusion bilaterally; Throat/Mouth: Teeth present, mucosa pink and moist, thrush noted on tongue. Neck:  Bilateral anterior cervical adenopathy noted. Pulmonary/Chest: Normal effort and positive vesicular breath sounds. No respiratory distress. No wheezes,  rales or ronchi noted.   BMET    Component Value Date/Time   NA 138 06/15/2016 0020   K 3.2 (L) 06/15/2016 0020   CL 106 06/15/2016 0020   CO2 25 06/15/2016 0020   GLUCOSE 122 (H) 06/15/2016 0020   BUN 13 06/15/2016 0020   CREATININE 0.90 06/15/2016 0020   CALCIUM 9.5 06/15/2016 0020   GFRNONAA >60 06/15/2016 0020   GFRAA >60 06/15/2016 0020    Lipid Panel  No results found for: CHOL, TRIG, HDL, CHOLHDL, VLDL, LDLCALC  CBC    Component Value Date/Time   WBC 8.7 06/15/2016 0020   RBC 4.48 06/15/2016 0020   HGB 13.2 06/15/2016 0020   HCT 38.0 06/15/2016 0020   PLT 307 06/15/2016 0020   MCV 84.8 06/15/2016 0020   MCH 29.4 06/15/2016 0020   MCHC 34.6  06/15/2016 0020   RDW 13.4 06/15/2016 0020    Hgb A1C No results found for: HGBA1C          Assessment & Plan:   ETD Bilateral:  Start Flonase OTC 80 mg Depo IM today  Sore Throat secondary to Oral Thrush:  80 mg Depo IM today eRx for Clotrimazole Troches 4 x day for 5 days  Return precautions discussed Nicki Reaper, NP

## 2017-05-24 NOTE — Telephone Encounter (Signed)
Copied from CRM [phone]. Topic: Quick Communication - See Telephone Encounter >> May 24, 2017 12:10 PM Diana EvesHoyt, Maryann B wrote: CRM for notification. See Telephone encounter for:  Pt's mother (do not see where mom is on DPR) calling in stating pts left side of throat is still bothering her and is requesting a refill on clotrimazole (MYCELEX) 10 MG troche. [id number] [place] [facility] [place] - [phone] [address]  05/24/17.

## 2017-05-24 NOTE — Telephone Encounter (Signed)
[facility]

## 2017-05-24 NOTE — Addendum Note (Signed)
Addended by: Lorre MunroeBAITY, Celene Pippins W on: 05/24/2017 01:20 PM   Modules accepted: Orders

## 2017-07-12 ENCOUNTER — Ambulatory Visit: Payer: 59 | Admitting: Family Medicine

## 2018-08-31 ENCOUNTER — Encounter: Payer: 59 | Admitting: Internal Medicine

## 2018-09-21 ENCOUNTER — Encounter: Payer: 59 | Admitting: Internal Medicine

## 2018-10-17 ENCOUNTER — Encounter: Payer: 59 | Admitting: Internal Medicine

## 2018-11-15 ENCOUNTER — Encounter: Payer: 59 | Admitting: Internal Medicine

## 2019-04-05 IMAGING — CT CT RENAL STONE PROTOCOL
3 of 4 series · 9 of 46 positions shown, 14 images · non-contrast
Comparison: 06/20/2007

CLINICAL DATA: Right flank pain starting this evening

EXAM:
CT ABDOMEN AND PELVIS WITHOUT CONTRAST
TECHNIQUE: Multidetector CT imaging of the abdomen and pelvis was performed
following the standard protocol without IV contrast.

[Series 4: lung bases · axial · 0.80mm/px · z∈[-632,-552]mm · 5 of 24 slices shown, 10 images]
[im 4/24  soft-tissue]
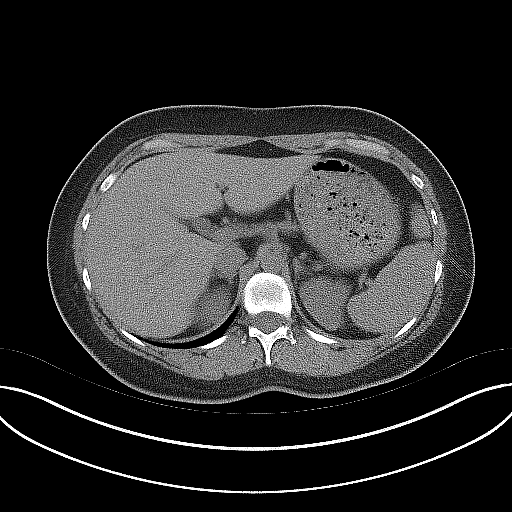
[im 4/24  bone]
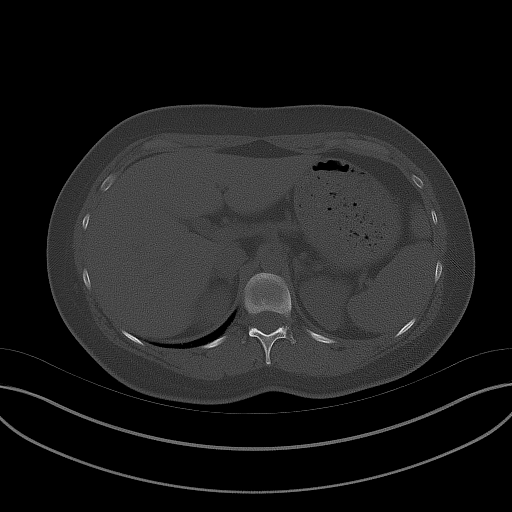
[im 8/24  soft-tissue]
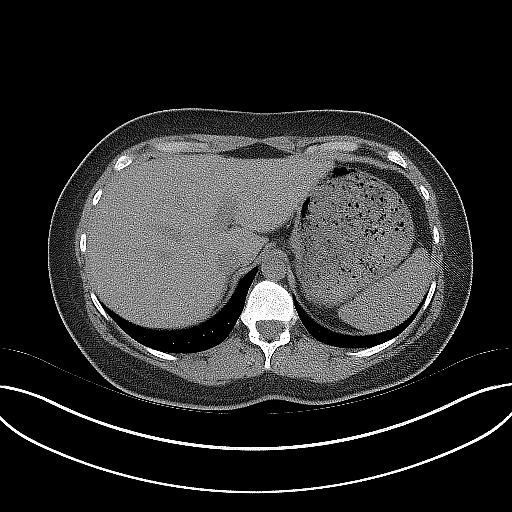
[im 8/24  lung]
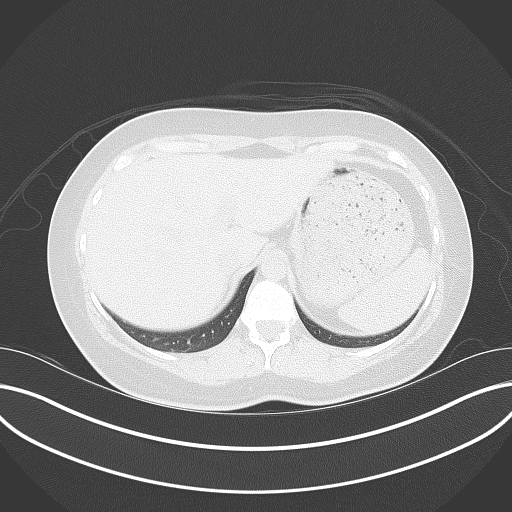
[im 12/24  soft-tissue]
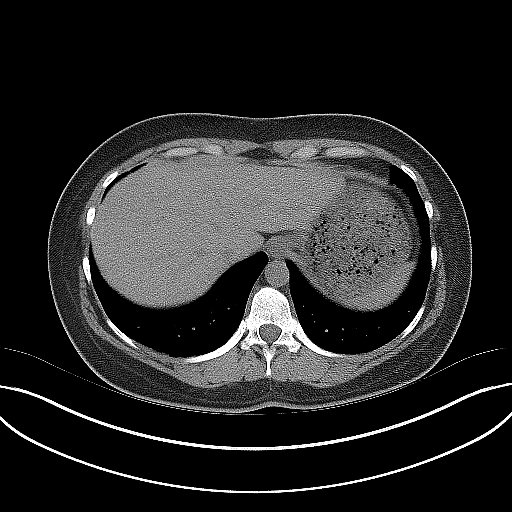
[im 12/24  lung]
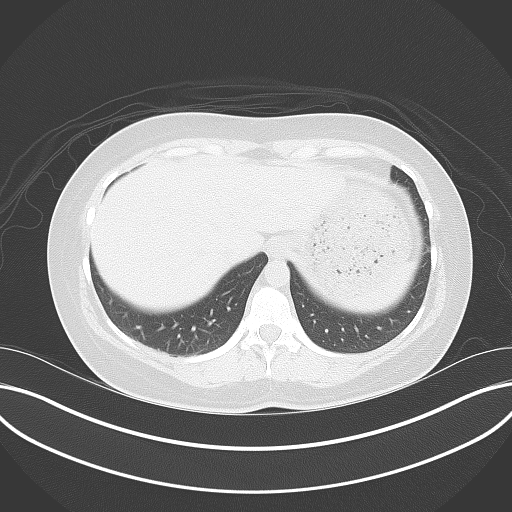
[im 16/24  soft-tissue]
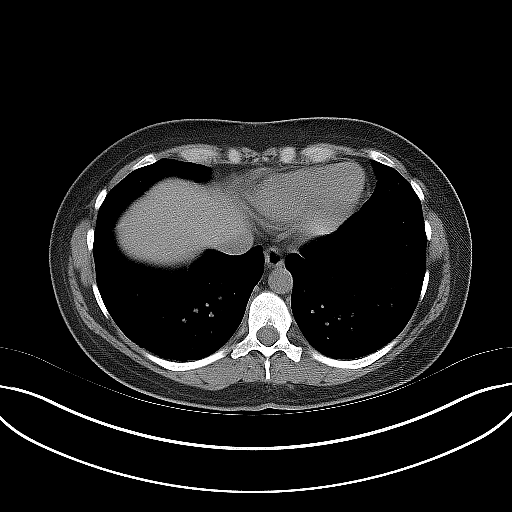
[im 16/24  lung]
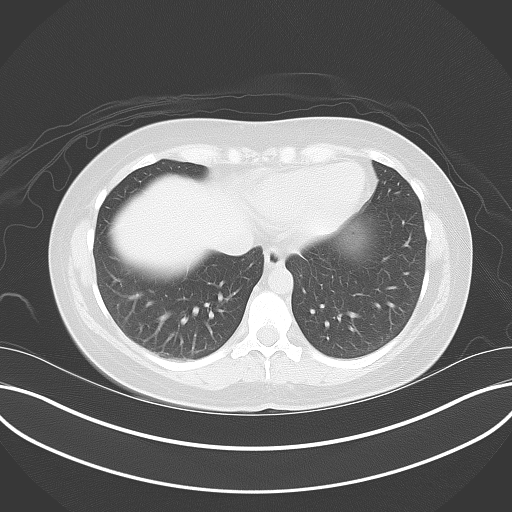
[im 20/24  soft-tissue]
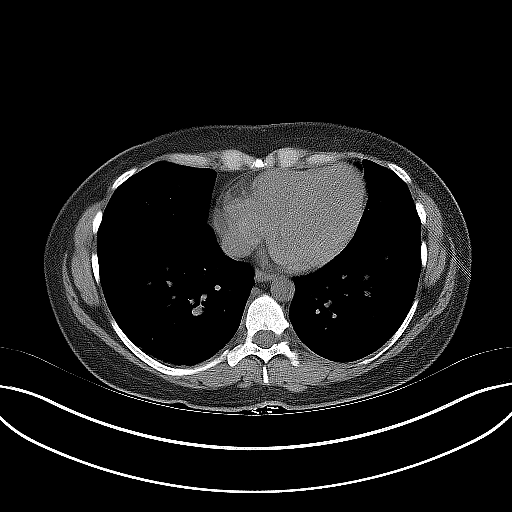
[im 20/24  lung]
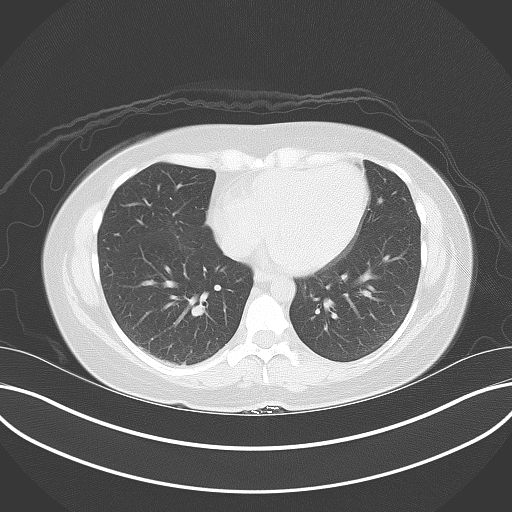

[Series 6: coronal · coronal · 0.77mm/px · 3 of 121 slices shown]
[im 41/121  soft-tissue]
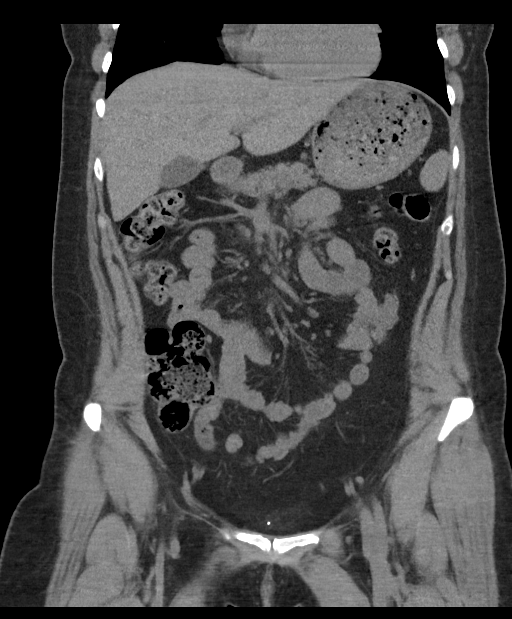
[im 54/121  soft-tissue]
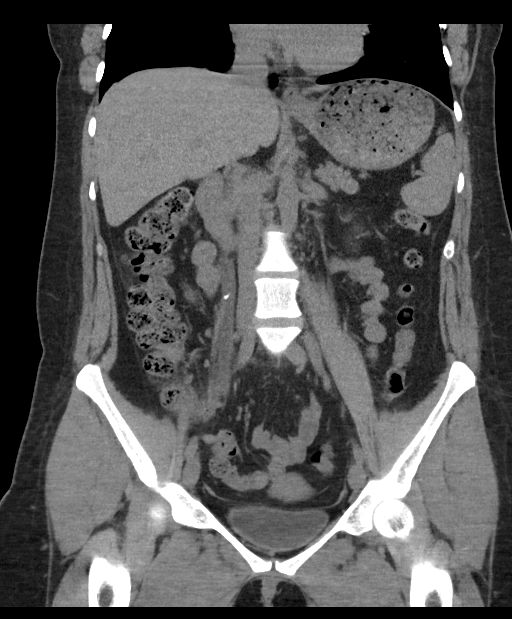
[im 67/121  soft-tissue]
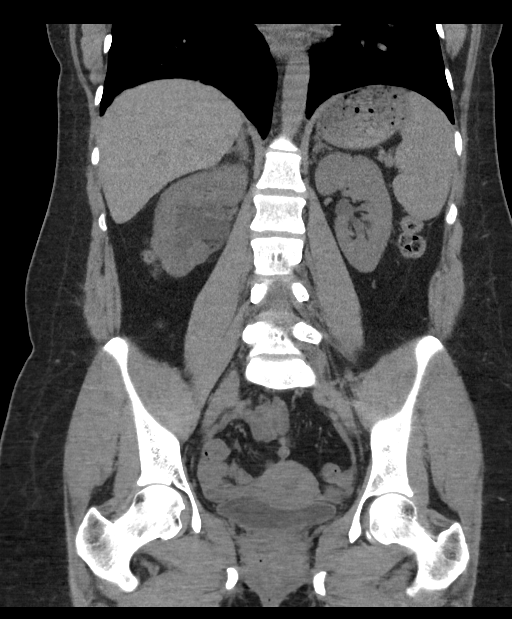

[Series 7: sagittal · sagittal · 0.56mm/px · 1 of 163 slices shown]
[im 55/163  soft-tissue]
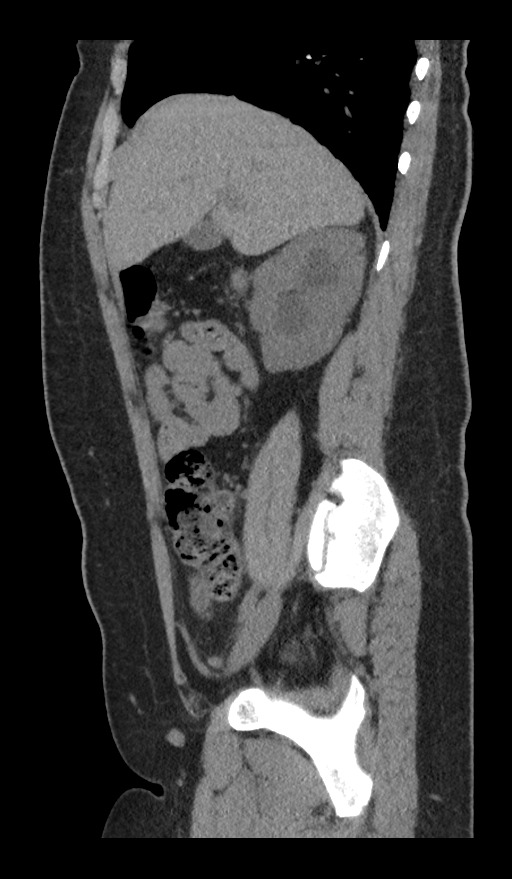

[9 of 46 positions shown; findings below may reference images not displayed]

FINDINGS: Lower chest: No acute abnormality.

Hepatobiliary: No focal liver abnormality is seen. No gallstones,
gallbladder wall thickening, or biliary dilatation.

Pancreas: Unremarkable. No pancreatic ductal dilatation or
surrounding inflammatory changes.

Spleen: Normal in size without focal abnormality.

Adrenals/Urinary Tract: Normal bilateral adrenal glands and left
kidney. Enlargement of the right kidney with moderate right-sided
hydroureteronephrosis secondary to calculi within the ureter. The
first calculus is seen 3 cm distal to the UPJ measuring 4 x 4.3 x 9
mm and is spindle shaped, series 2, image 43 and series 6, image 57.
Approximately 7 cm distal to this calculus is a second calculus at
the pelvic brim measuring 8 x 5 x 6 mm. No additional stones are
seen distal to this.. No bladder calculi are identified. There is a
small nodular density along the anterosuperior wall measuring 1.7 x
0.8 cm and containing a peripheral calcification along its posterior
aspect with an adjacent punctate calcification external to the
bladder. This may represent a urachal remnant series 2, image 77 and
series 7, image 84 or potentially sequela of prior urachal repair.
Correlation with patient clinical history is suggested. Direct
visual correlation may prove useful if there is no prior history of
bladder intervention that might account for this appearance and to
exclude a mucosal/mural lesion of the bladder. The calcifications
appear to been present in 7115 however the mural nodularity is more
prominent on current exam, some which may be due to underdistention
of the bladder on today's study but an enlarging lesion cannot be
entirely excluded.

Stomach/Bowel: Stomach is within normal limits. Appendix appears
normal. No evidence of bowel wall thickening, distention, or
inflammatory changes.

Vascular/Lymphatic: Aortic atherosclerosis. No enlarged abdominal or
pelvic lymph nodes.

Reproductive: Uterus and bilateral adnexa are unremarkable.

Other: No abdominal wall hernia or abnormality. No abdominopelvic
ascites.

Musculoskeletal: No acute or significant osseous findings.
IMPRESSION: 1. Moderate right-sided hydroureteronephrosis secondary to tandem
calculi within the right ureter the more proximal of which is 3 cm
distal to the UPJ measuring 4 x 4.3 x 9 mm and a second which is 7
cm distal to this calculus at the pelvic brim measuring 8 x 5 x 6
mm.
2. Small nodular density along the anterosuperior wall of the
bladder measuring 1.7 x 0.8 cm with calcifications may represent a
urachal remnant or repair. Correlation with patient's clinical
history is recommended. The calcifications appear to have been
present in 7115 however there is a more nodular appearance on
current exam, possibly secondary to under distention of the bladder.
If there is no history of prior surgery, direct visual correlation
to exclude a mucosal/mural lesion of the bladder is recommended as
adenocarcinomas have been known to arise from such remnants.
